# Patient Record
Sex: Female | Born: 1960 | Race: Black or African American | Hispanic: No | Smoking: Former smoker
Health system: Southern US, Community
[De-identification: ages and names within clinical notes are randomized; demographics above are authoritative.]

## PROBLEM LIST (undated history)

## (undated) DIAGNOSIS — K449 Diaphragmatic hernia without obstruction or gangrene: Secondary | ICD-10-CM

## (undated) DIAGNOSIS — B019 Varicella without complication: Secondary | ICD-10-CM

## (undated) DIAGNOSIS — E669 Obesity, unspecified: Secondary | ICD-10-CM

## (undated) DIAGNOSIS — J309 Allergic rhinitis, unspecified: Secondary | ICD-10-CM

## (undated) DIAGNOSIS — E785 Hyperlipidemia, unspecified: Secondary | ICD-10-CM

## (undated) HISTORY — PX: MENISCUS REPAIR: SHX5179

## (undated) HISTORY — DX: Allergic rhinitis, unspecified: J30.9

## (undated) HISTORY — PX: MYOMECTOMY: SHX85

## (undated) HISTORY — DX: Varicella without complication: B01.9

## (undated) HISTORY — DX: Hyperlipidemia, unspecified: E78.5

## (undated) HISTORY — DX: Diaphragmatic hernia without obstruction or gangrene: K44.9

## (undated) HISTORY — PX: TONSILLECTOMY AND ADENOIDECTOMY: SUR1326

## (undated) HISTORY — PX: DENTAL SURGERY: SHX609

## (undated) HISTORY — DX: Obesity, unspecified: E66.9

## (undated) HISTORY — PX: OOPHORECTOMY: SHX86

---

## 2000-12-22 ENCOUNTER — Emergency Department (HOSPITAL_COMMUNITY): Admission: EM | Admit: 2000-12-22 | Discharge: 2000-12-23 | Payer: Self-pay | Admitting: Emergency Medicine

## 2002-06-12 ENCOUNTER — Other Ambulatory Visit: Admission: RE | Admit: 2002-06-12 | Discharge: 2002-06-12 | Payer: Self-pay | Admitting: *Deleted

## 2003-06-17 ENCOUNTER — Emergency Department (HOSPITAL_COMMUNITY): Admission: EM | Admit: 2003-06-17 | Discharge: 2003-06-18 | Payer: Self-pay | Admitting: Emergency Medicine

## 2003-06-18 ENCOUNTER — Encounter: Payer: Self-pay | Admitting: Emergency Medicine

## 2003-08-29 ENCOUNTER — Encounter: Payer: Self-pay | Admitting: Internal Medicine

## 2003-08-29 ENCOUNTER — Ambulatory Visit (HOSPITAL_COMMUNITY): Admission: RE | Admit: 2003-08-29 | Discharge: 2003-08-29 | Payer: Self-pay | Admitting: Internal Medicine

## 2005-11-22 HISTORY — PX: HYSTEROSCOPY: SHX211

## 2005-12-15 ENCOUNTER — Other Ambulatory Visit: Admission: RE | Admit: 2005-12-15 | Discharge: 2005-12-15 | Payer: Self-pay | Admitting: Gynecology

## 2005-12-28 ENCOUNTER — Ambulatory Visit (HOSPITAL_BASED_OUTPATIENT_CLINIC_OR_DEPARTMENT_OTHER): Admission: RE | Admit: 2005-12-28 | Discharge: 2005-12-28 | Payer: Self-pay | Admitting: Gynecology

## 2005-12-28 ENCOUNTER — Encounter (INDEPENDENT_AMBULATORY_CARE_PROVIDER_SITE_OTHER): Payer: Self-pay | Admitting: *Deleted

## 2009-11-27 ENCOUNTER — Ambulatory Visit: Payer: Self-pay | Admitting: Gynecology

## 2009-11-27 ENCOUNTER — Other Ambulatory Visit: Admission: RE | Admit: 2009-11-27 | Discharge: 2009-11-27 | Payer: Self-pay | Admitting: Gynecology

## 2009-12-04 ENCOUNTER — Ambulatory Visit: Payer: Self-pay | Admitting: Gynecology

## 2009-12-16 ENCOUNTER — Ambulatory Visit: Payer: Self-pay | Admitting: Gynecology

## 2010-01-20 HISTORY — PX: ABDOMINAL HYSTERECTOMY: SHX81

## 2010-02-12 ENCOUNTER — Ambulatory Visit: Payer: Self-pay | Admitting: Gynecology

## 2010-02-18 ENCOUNTER — Inpatient Hospital Stay (HOSPITAL_COMMUNITY): Admission: RE | Admit: 2010-02-18 | Discharge: 2010-02-20 | Payer: Self-pay | Admitting: Gynecology

## 2010-02-18 ENCOUNTER — Ambulatory Visit: Payer: Self-pay | Admitting: Gynecology

## 2010-02-18 ENCOUNTER — Encounter: Payer: Self-pay | Admitting: Gynecology

## 2010-03-03 ENCOUNTER — Ambulatory Visit: Payer: Self-pay | Admitting: Gynecology

## 2010-03-17 ENCOUNTER — Ambulatory Visit: Payer: Self-pay | Admitting: Gynecology

## 2010-03-31 ENCOUNTER — Ambulatory Visit: Payer: Self-pay | Admitting: Gynecology

## 2011-02-15 LAB — COMPREHENSIVE METABOLIC PANEL
ALT: 66 U/L — ABNORMAL HIGH (ref 0–35)
AST: 42 U/L — ABNORMAL HIGH (ref 0–37)
Albumin: 3.9 g/dL (ref 3.5–5.2)
Alkaline Phosphatase: 125 U/L — ABNORMAL HIGH (ref 39–117)
BUN: 7 mg/dL (ref 6–23)
CO2: 24 mEq/L (ref 19–32)
Calcium: 9.6 mg/dL (ref 8.4–10.5)
Chloride: 106 mEq/L (ref 96–112)
Creatinine, Ser: 0.54 mg/dL (ref 0.4–1.2)
GFR calc Af Amer: >60 mL/min (ref >60)
GFR calc non Af Amer: >60 mL/min (ref >60)
Glucose, Bld: 92 mg/dL (ref 70–99)
Potassium: 4 mEq/L (ref 3.5–5.1)
Sodium: 138 mEq/L (ref 135–145)
Total Bilirubin: 0.4 mg/dL (ref 0.3–1.2)
Total Protein: 7.1 g/dL (ref 6.0–8.3)

## 2011-02-15 LAB — CBC
HCT: 23.3 % — ABNORMAL LOW (ref 36.0–46.0)
HCT: 39.3 % (ref 36.0–46.0)
Hemoglobin: 12.4 g/dL (ref 12.0–15.0)
Hemoglobin: 7.4 g/dL — ABNORMAL LOW (ref 12.0–15.0)
MCHC: 31.5 g/dL (ref 30.0–36.0)
MCHC: 31.9 g/dL (ref 30.0–36.0)
MCV: 76.7 fL — ABNORMAL LOW (ref 78.0–100.0)
MCV: 77 fL — ABNORMAL LOW (ref 78.0–100.0)
Platelets: 313 10*3/uL (ref 150–400)
Platelets: 357 10*3/uL (ref 150–400)
RBC: 3.03 MIL/uL — ABNORMAL LOW (ref 3.87–5.11)
RBC: 5.12 MIL/uL — ABNORMAL HIGH (ref 3.87–5.11)
RDW: 28 % — ABNORMAL HIGH (ref 11.5–15.5)
RDW: 28.6 % — ABNORMAL HIGH (ref 11.5–15.5)
WBC: 8 10*3/uL (ref 4.0–10.5)
WBC: 9.2 10*3/uL (ref 4.0–10.5)

## 2011-02-15 LAB — HCG, SERUM, QUALITATIVE: Preg, Serum: NEGATIVE

## 2011-04-09 NOTE — Op Note (Signed)
NAME:  Patty Perkins, Patty Perkins            ACCOUNT NO.:  [id number]   MEDICAL RECORD NO.:  [id number]          PATIENT TYPE:  AMB   LOCATION:  [facility]                         FACILITY:  [facility]   PHYSICIAN:  Timothy P. Fontaine, M.D.DATE OF BIRTH:  1961/10/28   DATE OF PROCEDURE:  12/28/2005  DATE OF DISCHARGE:                                 OPERATIVE REPORT   PREOPERATIVE DIAGNOSES:  Menorrhagia, leiomyomata.   POSTOPERATIVE DIAGNOSES:  Menorrhagia, leiomyomata.   PROCEDURE:  Hysteroscopic, myomectomy, D&C.   SURGEON:  Dr. Audie Box.   ANESTHETIC:  General.   ESTIMATED BLOOD LOSS:  Approximately 28-30 mL.   SORBITOL DISCREPANCY:  200 mL.   SPECIMEN:  1.  Endometrial curetting  2.  Leiomyomata.   COMPLICATIONS:  None.   FINDINGS:  EUA external BUS and vagina normal.  Cervix normal.  Uterus  bulky, approximately 14-16 weeks' size, irregular.  Adnexa without masses.  Hysteroscopic evaluation large posterior mid cavity myoma submucous mucus  approximately 50% within cavity, smaller 2 cm lower uterine segment anterior  myoma approximately 75% within cavity right, and left tubal ostia  visualized; fundus visualized grossly normal; lower uterine segment  otherwise unremarkable; endocervical canal normal.   PROCEDURE:  The patient was taken to the operating room, underwent general  anesthesia, was placed in low dorsal lithotomy position, received a perineal  vaginal preparation with Betadine solution per nursing personnel.  Bladder  emptied with in-and-out Foley catheterization.  EUA performed, and the  patient was draped in the usual fashion.  Cervix was visualized with a  surgical speculum, anterior lip grasped with a single-tooth tenaculum, and  the cervix was gently gradually dilated to admit the operative hysteroscope.  Hysteroscopy was performed with findings noted above.  Using the right angle  resectoscopic loop, the posterior submucous myoma was progressively resected  to the  level of the surrounding endometrium with multiple passes.  The  remaining portion of the myoma was left as it remained within the wall of  the uterus and was felt unsafe to resect it deeper.  The smaller anterior  lower uterine segment myoma was resected in its entirety through progressive  passes with the right-angle resectoscopic loop, as more of the myoma  protruded into the cavity with each removal of the resectoscope to remove  resected fragments.  Subsequently, using the roller ball coagulation, the  remaining posterior wall myoma surface was coagulated to achieve hemostasis  and hopefully to obliterate any remaining myoma.  A sharp curettage was  performed for sampling and at the end of the procedure, rehysteroscopy  showed no significant bleeding sites, good distension, no evidence of  perforation.  The instruments were removed.  Pressure was applied to the  tenaculum site on the cervix to achieve ultimate hemostasis.  Scant cervical  bleeding was noted.  The patient's bladder was in-and-out catheterized with  a Foley catheter in sterile technique, producing a small amount of clear  yellow urine.  The patient was then placed in the supine position, awakened  without difficulty, and taken to the recovery room in good condition, having  tolerated the procedure well.  Timothy P. Fontaine, M.D.  Electronically Signed     TPF/MEDQ  D:  12/28/2005  T:  12/28/2005  Job:  [id number]

## 2011-04-09 NOTE — H&P (Signed)
NAME:  Patty Perkins, Patty Perkins            ACCOUNT NO.:  [id number]   MEDICAL RECORD NO.:  [id number]          PATIENT TYPE:  AMB   LOCATION:  [facility]                         FACILITY:  [facility]   PHYSICIAN:  Timothy P. Fontaine, M.D.DATE OF BIRTH:  1961-08-05   DATE OF ADMISSION:  12/28/2005  DATE OF DISCHARGE:                                HISTORY & PHYSICAL   CHIEF COMPLAINT:  Leiomyomata, menorrhagia.   HISTORY OF PRESENT ILLNESS:  A 50 year old G0 with a long history of  leiomyomata, status post myomectomy in the past, who presents with a history  of increasing menorrhagia, who most recently underwent transfusion due to a  hemoglobin of 7, with a 16-week size irregular uterus.  Ultrasound confirms  multiple myomas.  Attempt at sonohystogram was unable to clearly visualize  the endometrium, and she is admitted for hysteroscopy, submucous myomectomy.   PAST SURGICAL HISTORY:  1.  Wisdom teeth.  2.  Tonsillectomy.  3.  Leiomyomectomy.   PAST MEDICAL HISTORY:  Uncomplicated.   ALLERGIES:  No medications.   REVIEW OF SYSTEMS:  Noncontributory.   FAMILY HISTORY:  Noncontributory.   SOCIAL HISTORY:  Noncontributory.   ADMISSION PHYSICAL EXAMINATION:  VITAL SIGNS:  Afebrile.  Vital signs are  stable.  HEENT:  Normal.  LUNGS:  Clear.  CARDIAC:  Regular rate and rhythm.  No murmurs or gallops.  ABDOMEN:  Benign.  PELVIC:  External BUS, vagina normal.  Cervix normal.  Uterus is  approximately 16 weeks size, irregular, consistent with leiomyomata.   ASSESSMENT AND PLAN:  A 50 year old G0 with history of leiomyomata with  menorrhagia, status post transfusion for hysteroscopy submucous  myomectomies.  I reviewed with the patient in detail the proposed surgery to  include what is involved with hysteroscopy and submucous myomectomy.  Options for most aggressive therapy such as hysterectomy, were reviewed, and  the options for this were discussed, and the patient would prefer an attempt  at  the more conservative hysteroscopy at this time.  She understands that  there are no guarantees as far as bleeding relief, that her bleeding may  persist, worsen or change following the procedure, given the number of  myomas that she has, and she understands that clearly we are only addressing  the intracavity portions of any myomas, and that we cannot go into the  myometrium to resect these, and that she will have myomas remaining  following the surgery, and she understands and accepts this.  I reviewed  with her the expected intraoperative and postoperative courses, the  instrumentation, risks of the resectoscope, and use of distended medium.  The risks including bleeding, hemorrhage, transfusion, and the risks of the  transfusion including transfusion reaction, hepatitis, HIV, mad cow disease,  and other unknown entities was discussed, understood and accepted.  The  risks of infection, both internal requiring prolonged antibiotics, as well  as the risks of perforation, damage to internal organs including bowel,  bladder, ureters, vessels and nerves necessitating major exploratory  reparative surgeries and future reparative surgeries including ostomy  formation, was all discussed, understood and accepted.  The risks of  sorbitol absorption leading  to metabolic complications and seizures were all  discussed, understood and accepted.  She understands that if we reach a  point where absorption is an issue, we may need to terminate the procedure  at that time; although we may not be totally done with resection, we may  have to stage the procedure and re-hysteroscope her at a future date.  Lastly, I emphasized there were no guarantees as far as menorrhagia relief  with the resectoscopic surgery, and that hysterectomy would afford absolute  relief, and she understands this and wants to proceed with a more  conservative procedure at this time.  Her thyroid studies were normal.  Her  current  hemoglobin is 11.  She did have a mild elevation in her SGOT, SGPT,  with a repeat value showing a normal SGOT and a decreasing SGPT.  The  patient's questions were answered to her satisfaction, and she is ready to  proceed with surgery.      Timothy P. Fontaine, M.D.  Electronically Signed     TPF/MEDQ  D:  12/27/2005  T:  12/27/2005  Job:  [id number]

## 2011-04-29 ENCOUNTER — Encounter (INDEPENDENT_AMBULATORY_CARE_PROVIDER_SITE_OTHER): Payer: Federal, State, Local not specified - PPO | Admitting: Gynecology

## 2011-04-29 ENCOUNTER — Other Ambulatory Visit: Payer: Self-pay | Admitting: Gynecology

## 2011-04-29 ENCOUNTER — Other Ambulatory Visit (HOSPITAL_COMMUNITY)
Admission: RE | Admit: 2011-04-29 | Discharge: 2011-04-29 | Disposition: A | Discharge status: Home / Self Care | Payer: Federal, State, Local not specified - PPO | Source: Ambulatory Visit | Attending: Gynecology | Admitting: Gynecology

## 2011-04-29 DIAGNOSIS — Z13220 Encounter for screening for lipoid disorders: Secondary | ICD-10-CM

## 2011-04-29 DIAGNOSIS — B373 Candidiasis of vulva and vagina: Secondary | ICD-10-CM

## 2011-04-29 DIAGNOSIS — R82998 Other abnormal findings in urine: Secondary | ICD-10-CM

## 2011-04-29 DIAGNOSIS — Z124 Encounter for screening for malignant neoplasm of cervix: Secondary | ICD-10-CM | POA: Insufficient documentation

## 2011-04-29 DIAGNOSIS — R635 Abnormal weight gain: Secondary | ICD-10-CM

## 2011-04-29 DIAGNOSIS — Z01419 Encounter for gynecological examination (general) (routine) without abnormal findings: Secondary | ICD-10-CM

## 2011-04-29 DIAGNOSIS — Z833 Family history of diabetes mellitus: Secondary | ICD-10-CM

## 2011-05-13 ENCOUNTER — Ambulatory Visit: Payer: Federal, State, Local not specified - PPO | Admitting: Gynecology

## 2011-05-13 ENCOUNTER — Other Ambulatory Visit: Payer: Federal, State, Local not specified - PPO

## 2011-06-03 ENCOUNTER — Ambulatory Visit: Payer: Federal, State, Local not specified - PPO | Admitting: Gynecology

## 2011-06-04 ENCOUNTER — Ambulatory Visit (INDEPENDENT_AMBULATORY_CARE_PROVIDER_SITE_OTHER): Payer: Federal, State, Local not specified - PPO | Admitting: Gynecology

## 2011-06-04 ENCOUNTER — Other Ambulatory Visit: Payer: Federal, State, Local not specified - PPO

## 2011-06-04 DIAGNOSIS — N949 Unspecified condition associated with female genital organs and menstrual cycle: Secondary | ICD-10-CM

## 2011-06-04 DIAGNOSIS — N898 Other specified noninflammatory disorders of vagina: Secondary | ICD-10-CM

## 2011-06-04 DIAGNOSIS — N760 Acute vaginitis: Secondary | ICD-10-CM

## 2011-06-04 DIAGNOSIS — B373 Candidiasis of vulva and vagina: Secondary | ICD-10-CM

## 2011-06-04 DIAGNOSIS — R1904 Left lower quadrant abdominal swelling, mass and lump: Secondary | ICD-10-CM

## 2011-06-07 ENCOUNTER — Other Ambulatory Visit: Payer: Self-pay | Admitting: Gynecology

## 2011-06-07 DIAGNOSIS — R1900 Intra-abdominal and pelvic swelling, mass and lump, unspecified site: Secondary | ICD-10-CM

## 2011-06-10 ENCOUNTER — Ambulatory Visit (HOSPITAL_COMMUNITY)
Admission: RE | Admit: 2011-06-10 | Discharge: 2011-06-10 | Disposition: A | Discharge status: Home / Self Care | Payer: Federal, State, Local not specified - PPO | Source: Ambulatory Visit | Attending: Gynecology | Admitting: Gynecology

## 2011-06-10 DIAGNOSIS — R1900 Intra-abdominal and pelvic swelling, mass and lump, unspecified site: Secondary | ICD-10-CM

## 2011-06-10 DIAGNOSIS — R1909 Other intra-abdominal and pelvic swelling, mass and lump: Secondary | ICD-10-CM | POA: Insufficient documentation

## 2011-06-10 MED ORDER — IOHEXOL 300 MG/ML  SOLN
100.0000 mL | Freq: Once | INTRAMUSCULAR | Status: AC | PRN
  Administered 2011-06-10: 100 mL via INTRAVENOUS

## 2011-06-15 ENCOUNTER — Telehealth: Payer: Self-pay | Admitting: Gynecology

## 2011-06-15 DIAGNOSIS — R1900 Intra-abdominal and pelvic swelling, mass and lump, unspecified site: Secondary | ICD-10-CM

## 2011-06-15 DIAGNOSIS — N898 Other specified noninflammatory disorders of vagina: Secondary | ICD-10-CM

## 2011-06-15 MED ORDER — METRONIDAZOLE 500 MG PO TABS: 500.0000 mg | ORAL_TABLET | Freq: Two times a day (BID) | ORAL | Status: AC

## 2011-06-15 NOTE — Telephone Encounter (Signed)
RX SENT TO PHARMACY. REFERRAL SENT TO [facility].

## 2011-06-15 NOTE — Telephone Encounter (Signed)
Called patient today in follow up of her pelvic CT which confirms a cystic mass measuring 12.8 x 17 cm a single enhancing septation. Mass does not contain fat or calcification no free pelvic fluid. 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 Her CA 125 return 3.2. I again does reviewed the differential to include a post surgical peritoneal inclusion cyst or adhesive cyst possible liquefied hematoma or an ovarian process as well as a possible diverticular process. Given the total picture I think referral to [facility] would be appropriate to allow for possible robotic surgery and to have available various specialties in the event of the various differential possibilities. Patient agrees with this plan and is accepting of referral to [facility]. We make these arrangements for her and provide them with a copy of my operative report, pathology report, postoperative notes and the CT and ultrasound reports. She also notes that the vaginal discharge I treated her for seem to get better but now recurred. I'm going to treat her with a more extended course of Flagyl 500 twice a day x14 days and my staff will call this in to [facility] in [place] on [address]. I did discuss with her I doubt that this cystic mass is draining into the vagina to account for her discharge as it would be highly unlikely as well as it wouldn't improve with treatment with antibiotics and then recur after stopping this. Encouraged patient should any questions or confusion to call me.

## 2011-06-22 ENCOUNTER — Encounter: Payer: Self-pay | Admitting: Gynecology

## 2011-07-22 HISTORY — PX: EXPLORATORY LAPAROTOMY: SUR591

## 2011-07-23 ENCOUNTER — Encounter: Payer: Self-pay | Admitting: Gynecology

## 2011-08-13 ENCOUNTER — Encounter: Payer: Self-pay | Admitting: Gynecology

## 2011-10-25 ENCOUNTER — Encounter: Payer: Self-pay | Admitting: Gynecology

## 2011-10-25 ENCOUNTER — Ambulatory Visit (INDEPENDENT_AMBULATORY_CARE_PROVIDER_SITE_OTHER): Payer: Federal, State, Local not specified - PPO | Admitting: Gynecology

## 2011-10-25 DIAGNOSIS — N898 Other specified noninflammatory disorders of vagina: Secondary | ICD-10-CM

## 2011-10-25 DIAGNOSIS — N939 Abnormal uterine and vaginal bleeding, unspecified: Secondary | ICD-10-CM

## 2011-10-25 DIAGNOSIS — B373 Candidiasis of vulva and vagina: Secondary | ICD-10-CM

## 2011-10-25 DIAGNOSIS — R21 Rash and other nonspecific skin eruption: Secondary | ICD-10-CM

## 2011-10-25 DIAGNOSIS — M79602 Pain in left arm: Secondary | ICD-10-CM

## 2011-10-25 DIAGNOSIS — M79609 Pain in unspecified limb: Secondary | ICD-10-CM

## 2011-10-25 NOTE — Patient Instructions (Signed)
Follow up if vaginal bleeding continues

## 2011-10-25 NOTE — Progress Notes (Signed)
Addended byCammie Mcgee T on: 10/25/2011 02:09 PM   Modules accepted: Orders

## 2011-10-25 NOTE — Progress Notes (Signed)
Patient presents for several issues. She is status post exploratory laparoscopy LSO for large inclusion cyst lysis of adhesions at [facility] in August. She notes recurrent skin rash with itching and irritation under her panniculus. Also last week had vaginal spotting for approximately one week. She is status post TAH RSO in the past and his most recent exploratory laparoscopy and also lysis of adhesions. She's not having any vaginal irritation or other symptoms she's not sexually active but noticed spontaneous bright red staining. Patient also asked for a referral to the physical therapist due to persistent left arm weakness and pain. She apparently had IV infiltration in the hospital in August and said ever since then she's had more weakness in her left arm. She has no neck pain but does note some pain that radiates down the arm from the upper to lower arm. She also asked for referral to plastic surgery to discussed having her panniculus excised the she has lost a fair amount of weight on purpose and has these recurrent skin rashes.  Exam HEENT: Grossly normal Extremities: Left arm with normal range of motion normal strength without sensory deficit good peripheral pulses and capillary refill. Abdomen: Slight dermatitis in the panniculus consistent with a yeast. Pelvic: External BUS vagina with slight brown staining upper vagina. After wiping no active areas of bleeding could be seen or lesions visualized. Bimanual without masses or tenderness vagina palpates soft without lesions. Subsequent colposcopy was normal noting no vaginal abnormalities visually.  Assessment and plan: 1. Vaginal staining. A little unusual she did not have an overt vaginitis no lesions were visualized or palpated both grossly or on colposcopic evaluation. She did have a little bit of whitish discharge KOH wet prep is positive for yeast. I discussed with patient at this point I will have to attribute her staining to yeast and I  treated her with Diflucan 150x1 dose follow up if her bleeding would persist.   2. Skin rash. The think this is a classic yeast dermatitis. I wrote her prescriptions for Mytrex cream to apply twice a day as needed follow up if symptoms persist or recur. 3. Plastic surgery. I gave her referral to several plastic surgeons in town for a complaint be evaluated for surgery. 4. Left arm pain with reported weakness. Physical exam is normal. Hard to attribute this all to an infiltrated IV. Refer to neurology for evaluation rule out other etiologies. If they feel physical therapy would be warranted they will refer her.

## 2011-10-26 ENCOUNTER — Telehealth: Payer: Self-pay | Admitting: *Deleted

## 2011-10-26 ENCOUNTER — Encounter: Payer: Self-pay | Admitting: Neurology

## 2011-10-26 ENCOUNTER — Other Ambulatory Visit: Payer: Self-pay | Admitting: *Deleted

## 2011-10-26 DIAGNOSIS — R29898 Other symptoms and signs involving the musculoskeletal system: Secondary | ICD-10-CM

## 2011-10-26 NOTE — Telephone Encounter (Signed)
Patient informed appointment set with Dr. Modesto Charon on 12/09/11 @ 2:30pm.  Order has been placed in pc.

## 2011-10-26 NOTE — Telephone Encounter (Signed)
Message copied by Valeda Malm L on Tue Oct 26, 2011 12:24 PM ------      Message from: Dara Lords      Created: Mon Oct 25, 2011  3:10 PM       Natina Wiginton, schedule an appointment with a neurologist in reference to left arm weakness and discomfort.

## 2011-12-08 ENCOUNTER — Ambulatory Visit: Payer: Federal, State, Local not specified - PPO | Admitting: Neurology

## 2011-12-09 ENCOUNTER — Ambulatory Visit: Payer: Federal, State, Local not specified - PPO | Admitting: Neurology

## 2011-12-09 ENCOUNTER — Encounter: Payer: Self-pay | Admitting: *Deleted

## 2011-12-09 NOTE — Progress Notes (Signed)
Patient ID: Patty Perkins, female   DOB: September 03, 1961, 51 y.o.   MRN: [id number] Pt called and left message on vm she needed records? Pt phone hung. I called and left message on her vm to call back.

## 2011-12-16 ENCOUNTER — Encounter: Payer: Self-pay | Admitting: *Deleted

## 2011-12-16 NOTE — Progress Notes (Signed)
Patient ID: Patty Perkins, female   DOB: 15-Mar-1961, 51 y.o.   MRN: [id number] Pt wanted recent office note faxed to dr [facility]ers office plastic surgeron referred pt to. Done and pt informed.

## 2011-12-31 ENCOUNTER — Ambulatory Visit (INDEPENDENT_AMBULATORY_CARE_PROVIDER_SITE_OTHER): Payer: Federal, State, Local not specified - PPO | Admitting: Neurology

## 2011-12-31 ENCOUNTER — Encounter: Payer: Self-pay | Admitting: Neurology

## 2011-12-31 VITALS — BP 116/78 | HR 96 | Wt 174.0 lb

## 2011-12-31 DIAGNOSIS — R29898 Other symptoms and signs involving the musculoskeletal system: Secondary | ICD-10-CM

## 2011-12-31 NOTE — Patient Instructions (Signed)
We will call you with your appointment for the nerve conduction studies.  They will be done at [facility] [address] [place], [place] [phone].

## 2011-12-31 NOTE — Progress Notes (Signed)
Dear Dr. Audie Box,  Thank you for having me see Patty Perkins in consultation today at [facility] for her problem with left arm pain and weakness.  As you may recall, she is a 51 y.o. year old female with a history of a resection of a large pelvic cyst in 07/22/2011 who apparently in the hospital during recovery had an infiltrated IV in her left antecubital area that resulted in arm swelling.  She says since that time she has had pain and weakness in the forearm, limiting use of the arm.  The pain is burning and stabbing, and can occur anytime.  The weakness is persistent.  She describes difficulty flexing at the elbow or extending the arm, or gripping items.  It does not sound like it has improved over time.  She does not get significant shoulder pain or neck pain.   Past Medical History  Diagnosis Date  . History of hiatal hernia     Past Surgical History  Procedure Date  . Abdominal hysterectomy 01/2010    TAH,RSO  . Hysteroscopy 2007    HYSTEROSCOPIC MYOMECTOMY  . Myomectomy   . Tonsillectomy and adenoidectomy   . Dental surgery     51 YO  . Exploratory laparotomy 07/22/2011    WITH EXT ADHESIOLYSIS, RESECTION OF LARGE PELVIC CYST, LSO AND PROCTOSCOPUY AT [facility]  . Oophorectomy     RSO W/TAH 01/2010--LSO W/ EXP LAP 07/22/11 AT [facility]  . Cervix lesion destruction     History   Social History  . Marital Status: Married    Spouse Name: N/A    Number of Children: N/A  . Years of Education: N/A   Social History Main Topics  . Smoking status: Former Smoker    Quit date: 12/30/2004  . Smokeless tobacco: Never Used  . Alcohol Use: No  . Drug Use: No  . Sexually Active: No   Other Topics Concern  . None   Social History Narrative  . None    Family History  Problem Relation Age of Onset  . Hypertension Mother     No current outpatient prescriptions on file prior to visit.    No Known Allergies    ROS:  13 systems were reviewed andare  unremarkable.   Examination:  Filed Vitals:   12/31/11 1517  BP: 116/78  Pulse: 96  Weight: 174 lb (78.926 kg)     In general, well appearing women.  Cardiovascular: The patient has a regular rate and rhythm and no carotid bruits.  Fundoscopy:  Disks are flat. Vessel caliber within normal limits.  Mental status:   The patient is oriented to person, place and time. Recent and remote memory are intact. Attention span and concentration are normal. Language including repetition, naming, following commands are intact. Fund of knowledge of current and historical events, as well as vocabulary are normal.  Cranial Nerves: Pupils are equally round and reactive to light. Visual fields full to confrontation. Extraocular movements are intact without nystagmus. Facial sensation and muscles of mastication are intact. Muscles of facial expression are symmetric. Hearing intact to bilateral finger rub. Tongue protrusion, uvula, palate midline.  Shoulder shrug intact  Motor: 4+ weakness of left shoulder abduction, left triceps extension, left forearm supination, left brachioradialis finger flexion(ulnar and median),finger extension;   5/5 biceps, wrist extension, ulnar and median wrist flexion, finger abduction;  everywhere else 5/5; atrophy of left forearm?  Reflexes:   Biceps  Triceps Brachioradialis Knee Ankle  Right 2+  2+  2+  2+ 2+  Left  2+  2+  2+   2+ 2+  Toes down  Coordination:  Normal finger to nose.  No dysdiadokinesia.  Sensation is decreased in her left hand but in a non-dermatomal and non-peripheral nerve pattern.  Gait and Station are normal.  Tandem gait is intact.  Romberg is negative   Impression/Recs: I am having a difficult time putting her weakness and symptoms in the distribution of one nerve.  If there is a nerve compression, it appears it may be radial nerve, but finger abduction, finger extension and shoulder abduction should not be weak in these cases.  Also, if  it is a  radial nerve palsy I can't explain how triceps could be weak from a IV infiltration in the forearm.  I am going to an EMG/NCS of the left upper extremity to determine if there is focal nerve damage.     We will see the patient back after her EMG/NCS.  Thank you for having [place] see Patty Perkins in consultation.  Feel free to contact me with any questions.  Lupita Raider Modesto Charon, MD [facility], [place] [address] [place], [place] [address] Phone: [phone] Fax: [phone].

## 2012-01-20 ENCOUNTER — Telehealth: Payer: Self-pay | Admitting: Neurology

## 2012-01-20 NOTE — Telephone Encounter (Signed)
You can offer that we send her to [facility] for a repeat EMG/NCS but warn her that I am not sure insurance will cover the test twice.

## 2012-01-20 NOTE — Telephone Encounter (Signed)
Called and spoke with the patient. Informed that the NCV/EMG was normal and without nerve damage. The patient states that she is very upset by this as she still cannot grip, make a fist and pick up heavy things. She proceeded to report that the facility where she had the test "kept getting false readings according to the technician and that the equipment wasn't the best." The patient states she will report this facility to the medical board. She wants to have another test where the equipment is reliable and the technicians know what they are doing." I told her I would let Dr. Modesto Charon know of her experience and we would be in touch.  **Dr. Modesto Charon, please advise.

## 2012-01-20 NOTE — Telephone Encounter (Signed)
Called and spoke with the patient. Info given as directed by Dr. Modesto Charon below. The patient understands that insurance may not pay for the procedure a second time. She wishes to proceed. I did call [facility]diagn[facility] and faxed over all requested information. They will fax back an appointment date and time. I will then let the patient know. The patient is fine with this plan.

## 2012-01-20 NOTE — Telephone Encounter (Signed)
Message copied by Benay Spice on Thu Jan 20, 2012  1:50 PM ------      Message from: Milas Gain      Created: Thu Jan 20, 2012  1:21 PM       Let Ms. Mallow know that her EMG/NCS of her LUE was normal.  No signs of any nerve damage.

## 2012-01-20 NOTE — Progress Notes (Signed)
EMG/NCS was negative for neuropathy or radiculopathy of the left UE.

## 2012-01-21 NOTE — Telephone Encounter (Signed)
Called and spoke with the patient. Information given re: appointment date and time for the NCV/EMG at [facility] on 04/11 at 0800 with Dr. Alphonzo Dublin. Phone number given [phone]) as well in case the pt needed to reschedule. No other issues at this time. A copy of the faxed letter from the Diagnostic Neurology Department will be mailed to her home address. That was verified with the patient.

## 2012-01-24 ENCOUNTER — Encounter: Payer: Self-pay | Admitting: Neurology

## 2012-01-28 ENCOUNTER — Ambulatory Visit (INDEPENDENT_AMBULATORY_CARE_PROVIDER_SITE_OTHER): Payer: Federal, State, Local not specified - PPO | Admitting: Gynecology

## 2012-01-28 ENCOUNTER — Encounter: Payer: Self-pay | Admitting: Gynecology

## 2012-01-28 DIAGNOSIS — R21 Rash and other nonspecific skin eruption: Secondary | ICD-10-CM

## 2012-01-28 DIAGNOSIS — N898 Other specified noninflammatory disorders of vagina: Secondary | ICD-10-CM

## 2012-01-28 MED ORDER — NYSTATIN 100000 UNIT/GM EX POWD: CUTANEOUS | Status: DC

## 2012-01-28 MED ORDER — FLUCONAZOLE 200 MG PO TABS: 200.0000 mg | ORAL_TABLET | Freq: Every day | ORAL | Status: AC

## 2012-01-28 MED ORDER — NYSTATIN-TRIAMCINOLONE 100000-0.1 UNIT/GM-% EX OINT: TOPICAL_OINTMENT | Freq: Two times a day (BID) | CUTANEOUS | Status: DC

## 2012-01-28 NOTE — Progress Notes (Signed)
Patient presents with recurrent skin rash and her panniculus and vaginal discharge. She notes when she takes the Diflucan intermittently her vaginal discharge this appears but it seems to return. 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 She had been using Mytrex type cream intermittently but has an episode now that's worse than before.  Exam Amy chaperone present Abdomen with an intense excoriated rash in her panniculus consistent with fungal with odor. Pelvic external BUS vagina with whitish discharge. Bimanual without masses or tenderness  Assessment and plan: Yeast skin rash in her panniculus and probable yeast vaginitis. We'll treat with Diflucan 200 daily x14 days given the intensity of the rash and I refilled her Mytrex cream to use twice a day. I also gave her prescription for Nystop powder to use once the rash is cleared a daily basis to help prevent recurrence. She is pursuing an abdominoplasty to have her panniculus removed which I think is very appropriate because of this intense fungal dermatitis that will continue to plague her until she does this. This certainly is not a cosmetic indication but a quality-of-life surgery. I also put a refill on her Diflucan and asked her after 14 days to take it once weekly as a prophylactic until she's able to arrange for surgery.

## 2012-01-28 NOTE — Patient Instructions (Signed)
Use the medications as prescribed. Pursue abdominoplasty surgery. Follow up with me when you're due for your annual exam.

## 2012-09-06 ENCOUNTER — Encounter: Payer: Federal, State, Local not specified - PPO | Admitting: Gynecology

## 2012-12-26 ENCOUNTER — Other Ambulatory Visit (HOSPITAL_COMMUNITY)
Admission: RE | Admit: 2012-12-26 | Discharge: 2012-12-26 | Disposition: A | Discharge status: Home / Self Care | Payer: Federal, State, Local not specified - PPO | Source: Ambulatory Visit | Attending: Gynecology | Admitting: Gynecology

## 2012-12-26 ENCOUNTER — Encounter: Payer: Self-pay | Admitting: Gynecology

## 2012-12-26 ENCOUNTER — Ambulatory Visit (INDEPENDENT_AMBULATORY_CARE_PROVIDER_SITE_OTHER): Payer: Federal, State, Local not specified - PPO | Admitting: Gynecology

## 2012-12-26 VITALS — BP 124/82 | Ht 63.0 in | Wt 196.0 lb

## 2012-12-26 DIAGNOSIS — Z01419 Encounter for gynecological examination (general) (routine) without abnormal findings: Secondary | ICD-10-CM | POA: Insufficient documentation

## 2012-12-26 DIAGNOSIS — Z13220 Encounter for screening for lipoid disorders: Secondary | ICD-10-CM

## 2012-12-26 DIAGNOSIS — Z8719 Personal history of other diseases of the digestive system: Secondary | ICD-10-CM | POA: Insufficient documentation

## 2012-12-26 DIAGNOSIS — N898 Other specified noninflammatory disorders of vagina: Secondary | ICD-10-CM

## 2012-12-26 LAB — LIPID PANEL
HDL: 52 mg/dL (ref >39)
Total CHOL/HDL Ratio: 4.6 Ratio

## 2012-12-26 LAB — COMPREHENSIVE METABOLIC PANEL
AST: 23 U/L (ref 0–37)
Alkaline Phosphatase: 125 U/L — ABNORMAL HIGH (ref 39–117)
BUN: 9 mg/dL (ref 6–23)
Calcium: 9.6 mg/dL (ref 8.4–10.5)
Chloride: 104 mEq/L (ref 96–112)
Creat: 0.59 mg/dL (ref 0.50–1.10)
Total Bilirubin: 0.5 mg/dL (ref 0.3–1.2)

## 2012-12-26 LAB — CBC WITH DIFFERENTIAL/PLATELET
Basophils Absolute: 0 10*3/uL (ref 0.0–0.1)
Basophils Relative: 0 % (ref 0–1)
Eosinophils Absolute: 0.3 10*3/uL (ref 0.0–0.7)
Eosinophils Relative: 5 % (ref 0–5)
HCT: 39.2 % (ref 36.0–46.0)
MCH: 26.9 pg (ref 26.0–34.0)
MCHC: 33.2 g/dL (ref 30.0–36.0)
MCV: 81 fL (ref 78.0–100.0)
Monocytes Absolute: 0.5 10*3/uL (ref 0.1–1.0)
RDW: 15.1 % (ref 11.5–15.5)

## 2012-12-26 LAB — WET PREP FOR TRICH, YEAST, CLUE
Trich, Wet Prep: NONE SEEN
Yeast Wet Prep HPF POC: NONE SEEN

## 2012-12-26 MED ORDER — IBUPROFEN 800 MG PO TABS: 800.0000 mg | ORAL_TABLET | Freq: Three times a day (TID) | ORAL | Status: DC | PRN

## 2012-12-26 MED ORDER — METRONIDAZOLE 500 MG PO TABS: 500.0000 mg | ORAL_TABLET | Freq: Two times a day (BID) | ORAL | Status: DC

## 2012-12-26 MED ORDER — NYSTATIN-TRIAMCINOLONE 100000-0.1 UNIT/GM-% EX OINT: TOPICAL_OINTMENT | Freq: Two times a day (BID) | CUTANEOUS | Status: DC

## 2012-12-26 NOTE — Progress Notes (Signed)
Patty Perkins June 01, 1961 [id number]        52 y.o.  G2P0020 for annual exam.  Several issues noted below.  Past medical history,surgical history, medications, allergies, family history and social history were all reviewed and documented in the EPIC chart. ROS:  Was performed and pertinent positives and negatives are included in the history.  Exam: Kim assistant Filed Vitals:   12/26/12 1202  BP: 124/82  Height: 5\' 3"  (1.6 m)  Weight: 196 lb (88.905 kg)   General appearance  Normal Skin grossly normal Head/Neck normal with no cervical or supraclavicular adenopathy thyroid normal Lungs  clear Cardiac RR, without RMG Abdominal  soft, nontender, without masses, organomegaly or hernia Breasts  examined lying and sitting without masses, retractions, discharge or axillary adenopathy. Pelvic  Ext/BUS/vagina  normal without overt discharge  Adnexa  Without masses or tenderness    Anus and perineum  normal   Rectovaginal  normal sphincter tone without palpated masses or tenderness.    Assessment/Plan:  52 y.o. G105P0020 female for annual exam.   1. Vaginal discharge. Patient's had a brownish discharge since surgery on and off by her history. She is status post TAH RSO 2011. Subsequent exploratory laparotomy adhesion lysis LSO resection of pelvic inclusion cyst 2012 at [facility].  Her vaginal discharge comes and goes. She's been treated for yeast and bacterial vaginosis in the past. She notes that the discharge always gets better with treatment but then recurs. She's not sexually active. Exam today is unremarkable without significant discharge. Wet prep again is unremarkable. I'm going to treat her as a low-level bacterial vaginosis with Flagyl 500 mg twice a day x14 days to see if we can eradicate this.  Alcohol avoidance discussed.  I reviewed sources of discharge with her to include unusual causes such as enterovaginal fistulas but this certainly does not sound like that nor can be documented  on vigorous vaginal exam. Patient will follow up if her discharge persists or recurs. 2. 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 Patient uses Mytrex which helps I refilled her x3. 3. Headaches. Patient notes over the past month daily headaches lasting all day requiring repetitive pain medication. No neurologic symptoms or classic migraine type auras.  We'll treat with Motrin 800 mg #30 every 8 hours 1 refill and recommend appointment with neurologist Dr. Clarisse Gouge. We'll go ahead and arrange this for her. 4. Pap smear 2012.  Pap done today because of this persistent discharge. No history of abnormal Pap smears previously. 5. Mammography. Patient's never had and I strongly urged her again to schedule. I reviewed the benefits of early detection and she acknowledges my recommendations. SBE monthly reviewed.  6. DEXA. We'll plan further into the menopause. Increase calcium vitamin D reviewed. 7. Colonoscopy 2009. Found polyps and they recommended repeat in 5 years which is this year. Patient will schedule colonoscopy this year. 8. Health maintenance. Mild elevated blood pressure noted to the patient. I asked her to have it rechecked in a non-exam situation to make sure it's normal. If it would remain elevated she knows to follow up.  Baseline CBC lipid profile comprehensive metabolic panel TSH vitamin D urinalysis ordered.   Dara Lords MD, 12:52 PM 12/26/2012

## 2012-12-26 NOTE — Patient Instructions (Signed)
Take Flagyl medication twice daily for 14 days. Avoid alcohol while taking.  Schedule colonoscopy with either [facility] gastro[facility] or [facility] gastroenterology.  Office and contact you to schedule an appointment with a neurologist in reference to your headaches.  .Call to Schedule your mammogram  Facilities in [place]: 1)  [facility] [place], [place] Phone: [phone] 2)  [facility], [id number] Phone: [phone] 3)  Dr. Yolanda Bonine at [facility] [address] Phone: [phone]     Mammogram A mammogram is an X-ray test to find changes in a woman's breast. You should get a mammogram if:  You are 21 years of age or older  You have risk factors.   Your doctor recommends that you have one.  BEFORE THE TEST  Do not schedule the test the week before your period, especially if your breasts are sore during this time.  On the day of your mammogram:  Wash your breasts and armpits well. After washing, do not put on any deodorant or talcum powder on until after your test.   Eat and drink as you usually do.   Take your medicines as usual.   If you are diabetic and take insulin, make sure you:   Eat before coming for your test.   Take your insulin as usual.   If you cannot keep your appointment, call before the appointment to cancel. Schedule another appointment.  TEST  You will need to undress from the waist up. You will put on a hospital gown.   Your breast will be put on the mammogram machine, and it will press firmly on your breast with a piece of plastic called a compression paddle. This will make your breast flatter so that the machine can X-ray all parts of your breast.   Both breasts will be X-rayed. Each breast will be X-rayed from above and from the side. An X-ray might need to be taken again if the picture is not good enough.   The mammogram will last  about 15 to 30 minutes.  AFTER THE TEST Finding out the results of your test Ask when your test results will be ready. Make sure you get your test results.  Document Released: 02/04/2009 Document Revised: 10/28/2011 Document Reviewed: 02/04/2009 [facility] Patient Information 2012 [place], [place].

## 2012-12-27 ENCOUNTER — Telehealth: Payer: Self-pay | Admitting: *Deleted

## 2012-12-27 LAB — URINALYSIS W MICROSCOPIC + REFLEX CULTURE
Glucose, UA: NEGATIVE mg/dL
Hgb urine dipstick: NEGATIVE
Leukocytes, UA: NEGATIVE
Protein, ur: NEGATIVE mg/dL

## 2012-12-27 LAB — VITAMIN D 25 HYDROXY (VIT D DEFICIENCY, FRACTURES): Vit D, 25-Hydroxy: 27 ng/mL — ABNORMAL LOW (ref 30–89)

## 2012-12-27 NOTE — Telephone Encounter (Signed)
Appt. On Feb 18 @ 1:40 pm. Pt informed with time and date.

## 2012-12-27 NOTE — Telephone Encounter (Signed)
Message copied by Aura Camps on Wed Dec 27, 2012  9:20 AM ------      Message from: Dara Lords      Created: Tue Dec 26, 2012  1:06 PM       Patient needs appointment with Dr. Rubye Beach in reference to persistent headaches.

## 2012-12-28 ENCOUNTER — Other Ambulatory Visit: Payer: Self-pay | Admitting: Gynecology

## 2012-12-28 DIAGNOSIS — E7800 Pure hypercholesterolemia, unspecified: Secondary | ICD-10-CM

## 2013-01-02 ENCOUNTER — Telehealth: Payer: Self-pay | Admitting: *Deleted

## 2013-01-02 DIAGNOSIS — R634 Abnormal weight loss: Secondary | ICD-10-CM

## 2013-01-02 NOTE — Telephone Encounter (Signed)
Great idea for the patient. Okay to set up consult with [facility].

## 2013-01-02 NOTE — Telephone Encounter (Signed)
Referral order placed, they will contact patient with appointment.

## 2013-01-02 NOTE — Telephone Encounter (Signed)
Pt would like referral consult to have a nutritious consult to help with weight loss. Pt was informed with her recent labs result and would to start working on her health. Okay to set consults?

## 2013-01-08 NOTE — Telephone Encounter (Signed)
Appt. 01/16/13 @ 8:00 a.m.

## 2013-01-16 ENCOUNTER — Encounter: Payer: Federal, State, Local not specified - PPO | Attending: Gynecology | Admitting: *Deleted

## 2013-01-16 ENCOUNTER — Encounter: Payer: Self-pay | Admitting: *Deleted

## 2013-01-16 DIAGNOSIS — Z713 Dietary counseling and surveillance: Secondary | ICD-10-CM | POA: Insufficient documentation

## 2013-01-16 NOTE — Patient Instructions (Addendum)
Goals:  Eat 3 meals/day, Avoid meal skipping   Increase protein rich foods  Choose more whole grains, lean protein, low-fat dairy, and fruits/non-starchy vegetables.   Aim for >30 min of physical activity daily  Limit sugar-sweetened beverages and concentrated sweets  Attend Bariatric Seminar at [facility] tonight (2/25).   Aim for (daily):  1400-1500 calories 175-190 g carbohydrates 85-95 g protein 38-42 g fat (<10-12 g as saturated fat)

## 2013-01-16 NOTE — Progress Notes (Signed)
  Medical Nutrition Therapy:  Appt start time: 0800 end time:  0930.  Assessment:  Obesity/Supervised Weight Loss Visit #1.  Patty Perkins is here today for help with weight loss.  States she is under extreme stress with job/living situation and overeats at meals.  Weight "jumps on me when I eat desserts", though she does not eat them all the time. Is currently traveling between [place] and [place] daily for her job and food choices are high calorie/Fat/CHO.  Recently joined the [employer] in [place] and has gone 5-6 times since joining. States she cannot do this by herself and inquires about bariatric surgery, specifically the LAGB. Discussed and answered her questions. Plans to attend the seminar at [facility].    MEDICATIONS:  Aleve (prn)   DIETARY INTAKE:   Usual eating pattern includes 2-3 meals and 0-1 snacks per day.  WHEN HOME B (AM): 4 pc reg bacon, scrambled eggs (2), 2 pcs white toast w/ butter; OJ or milk (16 oz)  Snk (AM): NONE  L (PM): Salad w/ veggies, croutons, and ranch dressing (1/2 c), 1 pc oatmeal cake w/ coconut & pecans, 2% milk (16 oz) Snk ( PM): NONE D ( PM): 3 Baked chicken wings, potato salad; Orange Hi-C (16 oz)   Snk ( PM): 1 pc oatmeal cake or another dessert  AT WORK IN [place] B (4 AM): [employer] SEC croissant  Snk (AM): NONE  L (9-10 AM):  Whopper w/ cheese; Grape Crush or sweet tea (16.9 oz) Snk (PM): NONE D (3 PM):  Taco Bell stuffed burrito OR Cookout "huge" cheeseburger   Snk (PM): NONE - sleeping  Usual physical activity: Goes to [facility] when in [place] on Wed-Fridays.  Estimated energy needs: 1400-1500 calories 175-190 g carbohydrates 85-95 g protein 38-42 g fat (<10-12g as saturated fat)  Progress Towards Goal(s):  In progress.   Nutritional Diagnosis:  [place]-3.3 Overweight/obesity related to poor food choices and sedentary lifestyle as evidenced by patient reported food recall and a BMI of 39.4 kg/m^2.    Intervention:  Nutrition education regarding better food choices,  exercise needs, and MyPlate. Discussed bariatric surgery, specifically the Lapband.  Handouts given during visit include:  Meal Planning Neurosurgeon and Wellness Services booklet  Monitoring/Evaluation:  Dietary intake, exercise, and body weight in 4 week(s).

## 2013-01-17 ENCOUNTER — Telehealth: Payer: Self-pay | Admitting: *Deleted

## 2013-01-17 NOTE — Telephone Encounter (Signed)
Pt went to see Nutrition consult and has decided to proceed with gastric sleeve surgery. Pt said she will need a letter of necessity stating why this proceed would help with her quality of life. She asked if you would please make letter sound good because this would determine if insurance company will pay. You may document information in this encounter and I can copy and set up a letter for you. Please advise

## 2013-01-18 ENCOUNTER — Encounter: Payer: Self-pay | Admitting: *Deleted

## 2013-01-18 NOTE — Telephone Encounter (Signed)
Patient has struggled with weight loss for years despite living an active lifestyle. She has attempted exercise and nutrition counseling without success. Her job requires her to be active which is becoming limited by her weight and due to having to travel frequently and eat out in restaurants her dietary choices are more difficult.  Her BMI over the past year has increased from 35 to 39.  She has issues with hypercholesterolemia and elevated LDL for which she will need to be treated with medication if she does not lose weight.  She also struggles with issues with her panniculus.  I think for the above reasons consideration for Lap Band -type surgery is reasonable if she decides to pursue that route.

## 2013-01-18 NOTE — Telephone Encounter (Signed)
Note faxed to Leandrew Koyanagi per pt request. [phone]

## 2013-01-19 ENCOUNTER — Other Ambulatory Visit: Payer: Self-pay | Admitting: Neurology

## 2013-01-19 DIAGNOSIS — R519 Headache, unspecified: Secondary | ICD-10-CM

## 2013-01-19 DIAGNOSIS — H9319 Tinnitus, unspecified ear: Secondary | ICD-10-CM

## 2013-01-22 ENCOUNTER — Encounter: Payer: Self-pay | Admitting: *Deleted

## 2013-01-30 ENCOUNTER — Other Ambulatory Visit: Payer: Federal, State, Local not specified - PPO

## 2013-01-31 ENCOUNTER — Ambulatory Visit (INDEPENDENT_AMBULATORY_CARE_PROVIDER_SITE_OTHER): Payer: Federal, State, Local not specified - PPO | Admitting: General Surgery

## 2013-01-31 ENCOUNTER — Encounter (INDEPENDENT_AMBULATORY_CARE_PROVIDER_SITE_OTHER): Payer: Self-pay | Admitting: General Surgery

## 2013-01-31 NOTE — Progress Notes (Signed)
Subjective:   morbid obesity  Patient ID: Patty Perkins, female   DOB: Sep 25, 1961, 52 y.o.   MRN: [id number]  HPI Patient is a very pleasant 52 year old female referred by Dr. Audie Box for consideration for surgical treatment for morbid obesity. The patient states that she has been overweight essentially her entire life. She has been through many many efforts at dieting and exercise be able to lose up to 45 pounds at a time but then just experiences progressive weight regain and additional weight. She does not feel that she is going to be do it on her own. She is for a concerned about her long-term health going forward. She does have chronic joint pain in her knees hip and back as well as plantar fasciitis. 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 She also has been found to have elevated cholesterol recently. She has been to our initial information seminar and has researched her options extensively and is interested in sleeve gastrectomy. She is a previous history of what sounds like esophageal dilatation by EGD some years ago and was told she had a small hiatal hernia but she currently has no symptoms of reflux and is on no medications for this.  Past Medical History  Diagnosis Date  . History of hiatal hernia   . Obesity    Past Surgical History  Procedure Laterality Date  . Abdominal hysterectomy  01/2010    TAH,RSO  . Hysteroscopy  2007    HYSTEROSCOPIC MYOMECTOMY  . Myomectomy    . Tonsillectomy and adenoidectomy    . Dental surgery      52 YO  . Exploratory laparotomy  07/22/2011    WITH EXT ADHESIOLYSIS, RESECTION OF LARGE PELVIC CYST, LSO AND PROCTOSCOPUY AT [facility]  . Oophorectomy      RSO W/TAH 01/2010--LSO W/ EXP LAP 07/22/11 AT [facility]   Current Outpatient Prescriptions  Medication Sig Dispense Refill  . naproxen sodium (ANAPROX) 220 MG tablet Take 220 mg by mouth as needed.       No current facility-administered medications for this visit.   No  Known Allergies History  Substance Use Topics  . Smoking status: Former Smoker    Quit date: 12/30/2004  . Smokeless tobacco: Never Used  . Alcohol Use: No     Review of Systems  Constitutional: Negative for fever and chills.  HENT: Negative.   Respiratory: Positive for shortness of breath. Negative for cough and chest tightness.   Cardiovascular: Positive for palpitations and leg swelling. Negative for chest pain.       The patient reports negative stress test some years ago when she had a syncopal episode but does not have a regular cardiologist  Gastrointestinal: Negative.   Genitourinary: Negative.   Psychiatric/Behavioral: Negative.        Objective:   Physical Exam BP 128/86  Pulse 71  Temp(Src) 97.5 F (36.4 C) (Temporal)  Resp 16  Ht 5\' 3"  (1.6 m)  Wt 228 lb 3.2 oz (103.511 kg)  BMI 40.43 kg/m2 General: Alert, morbidly obese African American female, in no distress Skin: Warm and dry without rash or infection. HEENT: No palpable masses or thyromegaly. Sclera nonicteric. Pupils equal round and reactive. Oropharynx clear. Lymph nodes: No cervical, supraclavicular, or inguinal nodes palpable. Lungs: Breath sounds clear and equal without increased work of breathing Cardiovascular: Regular rate and rhythm without murmur. No JVD or edema. Peripheral pulses intact. Abdomen: Nondistended. Soft and nontender. No masses palpable. No organomegaly.  No palpable hernias. Extremities: No edema or joint swelling or deformity. No chronic venous stasis changes. Neurologic: Alert and fully oriented. Gait normal.    Assessment:     52 year old female with progressive morbid obesity unresponsive to multiple efforts at medical management. She has comorbidities of chronic joint pain and elevated cholesterol. She certainly would have significant potential medical benefits to get her weight down to a healthier level. We discussed surgical options available including lap band, gastric sleeve  and gastric bypass. After our discussion she feels that she would like to proceed with gastric sleeve as it does not required extensive followup or foreign material of a lap band it is somewhat less extensive in regards to rerouting her intestinal tract then the bypass. I believe she would be a good candidate. We will go ahead with preoperative psych eval. She is already seen our nutritionist. Will get upper GI series and routine lab work. I will see her back following these studies.    Plan:     Preoperative workup for sleep gastrectomy as above

## 2013-02-01 ENCOUNTER — Other Ambulatory Visit (INDEPENDENT_AMBULATORY_CARE_PROVIDER_SITE_OTHER): Payer: Self-pay

## 2013-02-06 ENCOUNTER — Ambulatory Visit (HOSPITAL_COMMUNITY)
Admission: RE | Admit: 2013-02-06 | Discharge: 2013-02-06 | Disposition: A | Discharge status: Home / Self Care | Payer: Federal, State, Local not specified - PPO | Source: Ambulatory Visit | Attending: General Surgery | Admitting: General Surgery

## 2013-02-06 ENCOUNTER — Other Ambulatory Visit: Payer: Self-pay

## 2013-02-06 DIAGNOSIS — E7800 Pure hypercholesterolemia, unspecified: Secondary | ICD-10-CM | POA: Insufficient documentation

## 2013-02-06 DIAGNOSIS — M722 Plantar fascial fibromatosis: Secondary | ICD-10-CM | POA: Insufficient documentation

## 2013-02-06 DIAGNOSIS — M25569 Pain in unspecified knee: Secondary | ICD-10-CM | POA: Insufficient documentation

## 2013-02-06 DIAGNOSIS — Z6841 Body Mass Index (BMI) 40.0 and over, adult: Secondary | ICD-10-CM | POA: Insufficient documentation

## 2013-02-06 DIAGNOSIS — K449 Diaphragmatic hernia without obstruction or gangrene: Secondary | ICD-10-CM | POA: Insufficient documentation

## 2013-02-06 LAB — CBC WITH DIFFERENTIAL/PLATELET
Basophils Absolute: 0 10*3/uL (ref 0.0–0.1)
Basophils Relative: 0 % (ref 0–1)
MCHC: 33.3 g/dL (ref 30.0–36.0)
Monocytes Absolute: 0.5 10*3/uL (ref 0.1–1.0)
Neutro Abs: 4.7 10*3/uL (ref 1.7–7.7)
Neutrophils Relative %: 62 % (ref 43–77)
Platelets: 387 10*3/uL (ref 150–400)
RDW: 15 % (ref 11.5–15.5)

## 2013-02-06 LAB — COMPREHENSIVE METABOLIC PANEL
ALT: 40 U/L — ABNORMAL HIGH (ref 0–35)
AST: 26 U/L (ref 0–37)
Albumin: 4.5 g/dL (ref 3.5–5.2)
Alkaline Phosphatase: 158 U/L — ABNORMAL HIGH (ref 39–117)
BUN: 10 mg/dL (ref 6–23)
Potassium: 4.5 mEq/L (ref 3.5–5.3)
Sodium: 137 mEq/L (ref 135–145)

## 2013-02-06 LAB — LIPID PANEL
Cholesterol: 233 mg/dL — ABNORMAL HIGH (ref 0–200)
Total CHOL/HDL Ratio: 3.8 Ratio
VLDL: 31 mg/dL (ref 0–40)

## 2013-02-13 ENCOUNTER — Ambulatory Visit: Payer: Federal, State, Local not specified - PPO | Admitting: *Deleted

## 2013-02-19 ENCOUNTER — Encounter: Payer: Self-pay | Admitting: *Deleted

## 2013-02-19 ENCOUNTER — Encounter: Payer: Federal, State, Local not specified - PPO | Attending: Gynecology | Admitting: *Deleted

## 2013-02-19 DIAGNOSIS — Z713 Dietary counseling and surveillance: Secondary | ICD-10-CM | POA: Insufficient documentation

## 2013-02-19 NOTE — Progress Notes (Addendum)
  Medical Nutrition Therapy:  Appt start time: 0930 end time:  1000.  Assessment:  Obesity/Supervised Weight Loss Visit #2 of 4.  Has not made many changes to dietary intake, though states she is trying to decrease sweetened drinks.  [facility].  Attended bariatric seminar last month and is having LAGB.     MEDICATIONS:  Aleve (prn)   DIETARY INTAKE:  No changes from last visit. Usual eating pattern includes 2-3 meals and 0-1 snacks per day.  WHEN HOME B (AM): 4 pc reg bacon, scrambled eggs (2), 2 pcs white toast w/ butter; OJ or milk (16 oz)  Snk (AM): NONE  L (PM): Salad w/ veggies, croutons, and ranch dressing (1/2 c), 1 pc oatmeal cake w/ coconut & pecans, 2% milk (16 oz) Snk ( PM): NONE D ( PM): 3 Baked chicken wings, potato salad; Orange Hi-C (16 oz)   Snk ( PM): 1 pc oatmeal cake or another dessert  AT [place] IN [place] B (4 AM): [employer] SEC croissant  Snk (AM): NONE  L (9-10 AM):  Whopper w/ cheese; Grape Crush or sweet tea (16.9 oz) Snk (PM): NONE D (3 PM):  Taco Bell stuffed burrito OR Cookout "huge" cheeseburger   Snk (PM): NONE - sleeping  Usual physical activity: No exercise except [place]  Estimated energy needs: 1400-1500 calories 175-190 g carbohydrates 85-95 g protein 38-42 g fat (<10-12g as saturated fat)  Progress Towards Goal(s):  In progress.   Nutritional Diagnosis:  [place]-3.3 Overweight/obesity related to past poor dietary habits and physical inactivity as evidenced by patient attending supervised weight loss for bariatric surgery.    Intervention:  Nutrition education regarding better food choices, exercise needs, and MyPlate. Discussed resuming exercise to help lower cholesterol and high blood pressure.   Monitoring/Evaluation:  Dietary intake, exercise, and body weight in 4 week(s).

## 2013-02-19 NOTE — Patient Instructions (Addendum)
Goals:  Eat 3 meals/day, Avoid meal skipping   Increase protein rich foods  Choose more whole grains, lean protein, low-fat dairy, and fruits/non-starchy vegetables.   Aim for >30 min of physical activity daily  Limit sugar-sweetened beverages and concentrated sweets  Aim for (daily):  1400-1500 calories 175-190 g carbohydrates 85-95 g protein 38-42 g fat (<10-12 g as saturated fat)

## 2013-03-02 ENCOUNTER — Ambulatory Visit: Payer: Federal, State, Local not specified - PPO | Admitting: *Deleted

## 2013-03-15 ENCOUNTER — Ambulatory Visit: Payer: Federal, State, Local not specified - PPO

## 2013-03-20 ENCOUNTER — Encounter: Payer: Federal, State, Local not specified - PPO | Attending: Gynecology | Admitting: *Deleted

## 2013-03-20 ENCOUNTER — Encounter: Payer: Self-pay | Admitting: *Deleted

## 2013-03-20 DIAGNOSIS — Z713 Dietary counseling and surveillance: Secondary | ICD-10-CM | POA: Insufficient documentation

## 2013-03-20 NOTE — Progress Notes (Signed)
  Medical Nutrition Therapy:  Appt start time: 0920   End time:  0950.  Primary Concerns Today: Obesity/Supervised Weight Loss Visit   MEDICATIONS:  Aleve (prn)   DIETARY INTAKE:  No changes from last visit. Usual eating pattern includes 2-3 meals and 0-1 snacks per day.  Usual physical activity:  No exercise except [place]  Estimated energy needs: 1400-1500 calories 175-190 g carbohydrates 85-95 g protein 38-42 g fat (<10-12g as saturated fat)  Progress Towards Goal(s):  In progress.   Nutritional Diagnosis:  [place]-3.3 Overweight/obesity related to past poor dietary habits and physical inactivity as evidenced by patient attending supervised weight loss for bariatric surgery.    Intervention:  Continued nutrition education regarding better food choices, exercise needs, and MyPlate. Discussed lower sodium seasoning options and increasing exercise.   Monitoring/Evaluation:  Dietary intake, exercise, and body weight in 4 week(s).

## 2013-03-20 NOTE — Patient Instructions (Addendum)
Goals:  Eat 3 meals/day, Avoid meal skipping   Increase protein rich foods  Choose more whole grains, lean protein, low-fat dairy, and fruits/non-starchy vegetables.   Aim for >30 min of physical activity daily  Limit sugar-sweetened beverages and concentrated sweets  Aim for (daily):  1400-1500 calories 175-190 g carbohydrates 85-95 g protein 38-42 g fat (<10-12 g as saturated fat)  

## 2013-04-20 ENCOUNTER — Encounter: Payer: Self-pay | Admitting: *Deleted

## 2013-04-20 ENCOUNTER — Encounter: Payer: Federal, State, Local not specified - PPO | Attending: Gynecology | Admitting: *Deleted

## 2013-04-20 DIAGNOSIS — Z713 Dietary counseling and surveillance: Secondary | ICD-10-CM | POA: Insufficient documentation

## 2013-04-20 NOTE — Patient Instructions (Addendum)
   Follow Pre-Op Nutrition Goals to prepare for Sleeve Gastrectomy Surgery.   Call the [facility] at [phone] once you have been given your surgery date to enrolled in the Pre-Op Nutrition Class. You will need to attend this nutrition class 3-4 weeks prior to your surgery.  

## 2013-04-20 NOTE — Progress Notes (Signed)
  Pre-Op Assessment Visit:  Pre-Operative Sleeve Gastrectomy Surgery/Supervised Weight Loss  Medical Nutrition Therapy:  Appt start time: 0930   End time:  1000.  Patient was seen on 04/20/13 for Pre-Operative Sleeve Gastrectomy Nutrition Assessment. Assessment and letter of approval faxed to [facility] Bariatric Surgery Program coordinator on 04/23/13.  Approval letter sent to [facility] center and will be available in the chart under the media tab.  MEDICATIONS:  Aleve (prn)   DIETARY INTAKE:  No changes from last visit. Usual eating pattern includes 2-3 meals and 0-1 snacks per day.  Usual physical activity:  Increased exercise a little; [place]   Estimated energy needs: 1400-1500 calories 175-190 g carbohydrates 85-95 g protein 38-42 g fat (<10-12g as saturated fat)  Progress Towards Goal(s):  In progress.   Nutritional Diagnosis:  [place]-3.3 Overweight/obesity related to past poor dietary habits and physical inactivity as evidenced by patient attending supervised weight loss for bariatric surgery.    Intervention:  Continued nutrition education regarding better food choices, exercise needs, and bariatric surgery pre-op goals.  Handouts given during visit include:  Pre-Op Goals   Bariatric Surgery Protein Shakes  Plan: Patient to call for Pre-Op and Post-Op Nutrition Education at the [facility] when surgery is scheduled.

## 2013-07-19 ENCOUNTER — Ambulatory Visit (INDEPENDENT_AMBULATORY_CARE_PROVIDER_SITE_OTHER): Payer: Federal, State, Local not specified - PPO | Admitting: Gynecology

## 2013-07-19 ENCOUNTER — Encounter: Payer: Self-pay | Admitting: Gynecology

## 2013-07-19 VITALS — BP 134/84 | Ht 63.0 in | Wt 238.0 lb

## 2013-07-19 DIAGNOSIS — R350 Frequency of micturition: Secondary | ICD-10-CM

## 2013-07-19 DIAGNOSIS — R635 Abnormal weight gain: Secondary | ICD-10-CM

## 2013-07-19 DIAGNOSIS — R5383 Other fatigue: Secondary | ICD-10-CM

## 2013-07-19 DIAGNOSIS — Z13220 Encounter for screening for lipoid disorders: Secondary | ICD-10-CM

## 2013-07-19 DIAGNOSIS — R631 Polydipsia: Secondary | ICD-10-CM

## 2013-07-19 DIAGNOSIS — R5381 Other malaise: Secondary | ICD-10-CM

## 2013-07-19 LAB — CBC WITH DIFFERENTIAL/PLATELET
Basophils Absolute: 0 10*3/uL (ref 0.0–0.1)
Basophils Relative: 0 % (ref 0–1)
Lymphocytes Relative: 24 % (ref 12–46)
Neutro Abs: 6.9 10*3/uL (ref 1.7–7.7)
Neutrophils Relative %: 67 % (ref 43–77)
Platelets: 370 10*3/uL (ref 150–400)
RDW: 15.2 % (ref 11.5–15.5)
WBC: 10.2 10*3/uL (ref 4.0–10.5)

## 2013-07-19 LAB — COMPREHENSIVE METABOLIC PANEL
ALT: 46 U/L — ABNORMAL HIGH (ref 0–35)
AST: 28 U/L (ref 0–37)
Albumin: 4.3 g/dL (ref 3.5–5.2)
Alkaline Phosphatase: 148 U/L — ABNORMAL HIGH (ref 39–117)
Calcium: 9.7 mg/dL (ref 8.4–10.5)
Chloride: 102 mEq/L (ref 96–112)
Potassium: 4 mEq/L (ref 3.5–5.3)

## 2013-07-19 LAB — LIPID PANEL
LDL Cholesterol: 156 mg/dL — ABNORMAL HIGH (ref 0–99)
Total CHOL/HDL Ratio: 4.1 Ratio
Triglycerides: 186 mg/dL — ABNORMAL HIGH (ref <150)
VLDL: 37 mg/dL (ref 0–40)

## 2013-07-19 NOTE — Patient Instructions (Signed)
Follow up for lab work

## 2013-07-19 NOTE — Progress Notes (Signed)
Patient presents complaining of several issues all non-gynecologic. She's noting increasing fatigue, excessive thirst, some weight gain and urinary frequency. She is scheduled for gastric bypass surgery in November. Does have a family history of hypercholesterolemia and hypertension with cardiovascular disease. No hair or skin changes. No neurologic symptoms such as dizziness lightheaded blurred vision. No chest pain cough sputum. No peripheral edema.  No diarrhea constipation. Does have a primary physician but wanted to followup with me. I reviewed with her that I would refer her for any abnormalities but I would be glad to at least examine her and do some basic lab work today.  Exam Blood pressure 134/84, pulse 80, BMI 42 Skin grossly normal without evidence of rash or lesions HEENT normal without evidence of thyroid enlargement or nodularity. No evidence of cervical adenopathy Lungs are clear bilaterally Cardiac regular rate without rubs murmurs or gallops Abdomen obese, soft without gross masses, tenderness, organomegaly or hernia Extremities without deformity or evidence of edema  Assessment and plan: Symptoms as noted above. Will check baseline CBC comprehensive metabolic panel hemoglobin A1c lipid profile vitamin D TSH and urinalysis. Patient will followup for lab results. Otherwise will follow up with her surgeons for upcoming surgery.

## 2013-07-20 LAB — HEMOGLOBIN A1C: Hgb A1c MFr Bld: 6.2 % — ABNORMAL HIGH (ref <5.7)

## 2013-07-20 LAB — URINALYSIS W MICROSCOPIC + REFLEX CULTURE
Bacteria, UA: NONE SEEN
Bilirubin Urine: NEGATIVE
Hgb urine dipstick: NEGATIVE
Ketones, ur: NEGATIVE mg/dL
Protein, ur: NEGATIVE mg/dL
Urobilinogen, UA: 0.2 mg/dL (ref 0.0–1.0)

## 2013-07-24 ENCOUNTER — Other Ambulatory Visit: Payer: Self-pay | Admitting: Gynecology

## 2013-07-24 ENCOUNTER — Encounter: Payer: Self-pay | Admitting: Gynecology

## 2013-07-24 ENCOUNTER — Telehealth: Payer: Self-pay

## 2013-07-24 DIAGNOSIS — R7989 Other specified abnormal findings of blood chemistry: Secondary | ICD-10-CM

## 2013-07-24 NOTE — Telephone Encounter (Signed)
Letter of support provided

## 2013-07-24 NOTE — Telephone Encounter (Signed)
Patient called stating that she received denial letter from ins co for her weight loss surgery.  She said that the ins co needs two things from you.   (1) Proof stating that she has been morbidly obese (BMI 40>) for 2 years OR BMI 35> along with two co-morbidities x 2 years. (At her last CE BMI was less that 35 and in the past it looks like right at 35 but she was not on meds and I did not note co-morbidities.)  (2) Chart notes showing what she has tried to lose weight in the last year.  (Pt. Said the only thing she tried was B12 shots and she hoped you put that in your note. I did not see it.)  Patient said that the surgeon's office stressed that she should come in to our office and sit down with someone and go through her chart together to look for this info.   I tried to explain that we do not really have time for this and we can look the chart for her and glean this info.  Paper chart is on your desk. Please advise. Thanks

## 2013-07-27 ENCOUNTER — Other Ambulatory Visit: Payer: Federal, State, Local not specified - PPO

## 2013-07-27 DIAGNOSIS — R7989 Other specified abnormal findings of blood chemistry: Secondary | ICD-10-CM

## 2013-07-27 LAB — COMPREHENSIVE METABOLIC PANEL
ALT: 26 U/L (ref 0–35)
AST: 20 U/L (ref 0–37)
Albumin: 4.1 g/dL (ref 3.5–5.2)
Alkaline Phosphatase: 119 U/L — ABNORMAL HIGH (ref 39–117)
Calcium: 9.3 mg/dL (ref 8.4–10.5)
Chloride: 104 mEq/L (ref 96–112)
Potassium: 3.8 mEq/L (ref 3.5–5.3)
Sodium: 141 mEq/L (ref 135–145)

## 2013-08-10 ENCOUNTER — Telehealth: Payer: Self-pay | Admitting: *Deleted

## 2013-08-10 NOTE — Telephone Encounter (Signed)
Pt would like a name of a PCP to get established to help her with her non Gyn health. She values you opinion for a good physician. Please advise

## 2013-08-11 NOTE — Telephone Encounter (Signed)
[facility].  No specific MD

## 2013-08-13 NOTE — Telephone Encounter (Signed)
Pt informed with the below note. 

## 2014-07-30 ENCOUNTER — Telehealth: Payer: Self-pay

## 2014-07-30 NOTE — Telephone Encounter (Signed)
I am not sure what she needs, but as her gynecologist I don't think I am the one to refer her as I have not been treating her for this.

## 2014-07-30 NOTE — Telephone Encounter (Signed)
Patient called requesting that you give her referral to Dr. Heide Guile. [place] at "[facility]" .  She said this is pertaining to her hiatal hernia and she is "going to be able to have it removed but they need my medical records" and someone from the doctor's office to call them.  I looked back through your notes for the last several years and did not see mention of hiatal hernia.

## 2014-07-30 NOTE — Telephone Encounter (Signed)
Left message to call.

## 2014-07-31 ENCOUNTER — Encounter: Payer: Self-pay | Admitting: Internal Medicine

## 2014-07-31 NOTE — Telephone Encounter (Signed)
Patient informed. Upon review of her chart she had seen Dr. Carlean Purl back in 2004.  She said she will call him for appt.

## 2014-09-23 ENCOUNTER — Encounter: Payer: Self-pay | Admitting: Gynecology

## 2014-09-30 ENCOUNTER — Ambulatory Visit: Payer: Federal, State, Local not specified - PPO | Admitting: Internal Medicine

## 2017-03-07 ENCOUNTER — Telehealth: Payer: Self-pay

## 2017-03-07 NOTE — Telephone Encounter (Signed)
Left message with name/# in her voice mail.

## 2017-03-07 NOTE — Telephone Encounter (Signed)
I personally see Dr. Garret Reddish

## 2017-03-07 NOTE — Telephone Encounter (Signed)
Patient wants recommendation from you for a PCP who practices medicine like you do and who is as wonderful as you are.

## 2017-09-26 DIAGNOSIS — M25562 Pain in left knee: Secondary | ICD-10-CM | POA: Diagnosis not present

## 2017-10-01 DIAGNOSIS — M25562 Pain in left knee: Secondary | ICD-10-CM | POA: Diagnosis not present

## 2017-10-03 DIAGNOSIS — M25562 Pain in left knee: Secondary | ICD-10-CM | POA: Diagnosis not present

## 2017-10-05 DIAGNOSIS — H9203 Otalgia, bilateral: Secondary | ICD-10-CM | POA: Diagnosis not present

## 2017-10-05 DIAGNOSIS — J0100 Acute maxillary sinusitis, unspecified: Secondary | ICD-10-CM | POA: Diagnosis not present

## 2017-10-05 DIAGNOSIS — R05 Cough: Secondary | ICD-10-CM | POA: Diagnosis not present

## 2017-11-09 DIAGNOSIS — Y999 Unspecified external cause status: Secondary | ICD-10-CM | POA: Diagnosis not present

## 2017-11-09 DIAGNOSIS — G8918 Other acute postprocedural pain: Secondary | ICD-10-CM | POA: Diagnosis not present

## 2017-11-09 DIAGNOSIS — S83242A Other tear of medial meniscus, current injury, left knee, initial encounter: Secondary | ICD-10-CM | POA: Diagnosis not present

## 2017-11-09 DIAGNOSIS — S83282A Other tear of lateral meniscus, current injury, left knee, initial encounter: Secondary | ICD-10-CM | POA: Diagnosis not present

## 2017-11-09 DIAGNOSIS — S83232A Complex tear of medial meniscus, current injury, left knee, initial encounter: Secondary | ICD-10-CM | POA: Diagnosis not present

## 2017-11-09 DIAGNOSIS — M1712 Unilateral primary osteoarthritis, left knee: Secondary | ICD-10-CM | POA: Diagnosis not present

## 2017-11-17 DIAGNOSIS — S83282D Other tear of lateral meniscus, current injury, left knee, subsequent encounter: Secondary | ICD-10-CM | POA: Diagnosis not present

## 2017-11-17 DIAGNOSIS — S83242D Other tear of medial meniscus, current injury, left knee, subsequent encounter: Secondary | ICD-10-CM | POA: Diagnosis not present

## 2017-11-21 DIAGNOSIS — M25562 Pain in left knee: Secondary | ICD-10-CM | POA: Diagnosis not present

## 2017-11-23 DIAGNOSIS — M25562 Pain in left knee: Secondary | ICD-10-CM | POA: Diagnosis not present

## 2017-12-17 DIAGNOSIS — M549 Dorsalgia, unspecified: Secondary | ICD-10-CM | POA: Diagnosis not present

## 2017-12-19 DIAGNOSIS — S83282D Other tear of lateral meniscus, current injury, left knee, subsequent encounter: Secondary | ICD-10-CM | POA: Diagnosis not present

## 2017-12-19 DIAGNOSIS — S83242D Other tear of medial meniscus, current injury, left knee, subsequent encounter: Secondary | ICD-10-CM | POA: Diagnosis not present

## 2018-02-20 ENCOUNTER — Ambulatory Visit: Payer: Federal, State, Local not specified - PPO | Admitting: Family Medicine

## 2018-02-20 ENCOUNTER — Encounter: Payer: Self-pay | Admitting: Family Medicine

## 2018-02-20 VITALS — BP 128/88 | HR 88 | Temp 98.5°F | Ht 63.75 in | Wt 207.4 lb

## 2018-02-20 DIAGNOSIS — Z23 Encounter for immunization: Secondary | ICD-10-CM

## 2018-02-20 DIAGNOSIS — R739 Hyperglycemia, unspecified: Secondary | ICD-10-CM | POA: Insufficient documentation

## 2018-02-20 DIAGNOSIS — E785 Hyperlipidemia, unspecified: Secondary | ICD-10-CM | POA: Diagnosis not present

## 2018-02-20 DIAGNOSIS — J301 Allergic rhinitis due to pollen: Secondary | ICD-10-CM | POA: Diagnosis not present

## 2018-02-20 DIAGNOSIS — Z1159 Encounter for screening for other viral diseases: Secondary | ICD-10-CM

## 2018-02-20 DIAGNOSIS — D1779 Benign lipomatous neoplasm of other sites: Secondary | ICD-10-CM | POA: Diagnosis not present

## 2018-02-20 DIAGNOSIS — E669 Obesity, unspecified: Secondary | ICD-10-CM | POA: Insufficient documentation

## 2018-02-20 DIAGNOSIS — Z114 Encounter for screening for human immunodeficiency virus [HIV]: Secondary | ICD-10-CM

## 2018-02-20 DIAGNOSIS — J309 Allergic rhinitis, unspecified: Secondary | ICD-10-CM | POA: Insufficient documentation

## 2018-02-20 DIAGNOSIS — D179 Benign lipomatous neoplasm, unspecified: Secondary | ICD-10-CM | POA: Insufficient documentation

## 2018-02-20 DIAGNOSIS — Z87891 Personal history of nicotine dependence: Secondary | ICD-10-CM | POA: Insufficient documentation

## 2018-02-20 HISTORY — DX: Hyperlipidemia, unspecified: E78.5

## 2018-02-20 NOTE — Progress Notes (Signed)
Phone: [phone]  Subjective:  Patient presents today to establish care.  Prior patient of Dr. Phineas Real only for OBGYN- she was advised to get family physician as well. Chief complaint-noted.   See problem oriented charting  The following were reviewed and entered/updated in epic: Past Medical History:  Diagnosis Date  . Allergic rhinitis    pollen- neti pots  . Chicken pox   . Hiatal hernia    no issues per patient  . Hyperlipidemia 02/20/2018  . Obesity    Patient Active Problem List   Diagnosis Date Noted  . Hyperlipidemia 02/20/2018    Priority: Medium  . Hyperglycemia 02/20/2018    Priority: Medium  . Former smoker 02/20/2018    Priority: Low  . Allergic rhinitis     Priority: Low  . History of hiatal hernia     Priority: Low  . Lipoma 02/20/2018   Past Surgical History:  Procedure Laterality Date  . ABDOMINAL HYSTERECTOMY  01/2010   TAH,RSO. large fibroids  . DENTAL SURGERY     57 yo- wisdom teeth  . EXPLORATORY LAPAROTOMY  07/22/2011   WITH EXT ADHESIOLYSIS, RESECTION OF LARGE PELVIC CYST, LSO AND PROCTOSCOPUY AT [facility]  . HYSTEROSCOPY  2007   HYSTEROSCOPIC MYOMECTOMY. states 2 removals then later hystrectomy  . MENISCUS REPAIR     R in [place]. later left in [place]  . MYOMECTOMY    . OOPHORECTOMY     one side. RSO W/TAH 01/2010--LSO W/ EXP LAP 07/22/11 AT [facility]  . TONSILLECTOMY AND ADENOIDECTOMY      Family History  Problem Relation Age of Onset  . Hypertension Mother   . Hyperlipidemia Mother   . Heart disease Mother        Stent age 69. defibrillator.   . Ovarian cancer Sister        Half sister  . Liver cancer Brother   . Heart disease Brother        mid 52s- first stent  . Diabetes Brother   . Hypertension Brother   . Hyperlipidemia Brother   . Prostate cancer Father   . Heart attack Maternal Grandmother        88  . Stroke Maternal Grandfather     Medications- reviewed and updated Current Outpatient Medications  Medication Sig Dispense  Refill  . Ascorbic Acid (VITAMIN C) 1000 MG tablet Take 1,000 mg by mouth daily.    Marland Kitchen b complex vitamins capsule Take 1 capsule by mouth daily.    . cholecalciferol (VITAMIN D) 400 units TABS tablet Take 400 Units by mouth.    . vitamin A 10000 UNIT capsule Take 10,000 Units by mouth daily.    . Zinc 25 MG TABS Take 1 tablet by mouth daily.     No current facility-administered medications for this visit.     Allergies-reviewed and updated No Known Allergies  Social History   Social History Narrative   Divorced. No children. Fransisco Beau terrier.    Mother lives with her- she is the caregiver      Work in [place] - [place] at [place]. 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    Loves her job   Control and instrumentation engineer- history/english.       Hobbies: TV nut- enjoys old movies    ROS--Full ROS was completed and negative except for lump on right hip, weight loss but intentional ROS  Objective: BP 128/88 (BP Location: Left Arm, Patient Position: Sitting, Cuff Size: Large)   Pulse 88  Temp 98.5 F (36.9 C) (Oral)   Ht 5' 3.75" (1.619 m)   Wt 207 lb 6.4 oz (94.1 kg)   SpO2 93%   BMI 35.88 kg/m  Gen: NAD, resting comfortably HEENT: Mucous membranes are moist. Oropharynx normal. TM normal. Eyes: sclera and lids normal, PERRLA Neck: no thyromegaly, no cervical lymphadenopathy CV: RRR no murmurs rubs or gallops Lungs: CTAB no crackles, wheeze, rhonchi Abdomen: soft/nontender/nondistended/normal bowel sounds. No rebound or guarding. overweight Ext: no edema Skin: warm, dry, no rash On low back/upper right hip there is a 2-3 x 2-3 cm mobile subcutaneous soft lesion without obvious head to it Neuro: 5/5 strength in upper and lower extremities, normal gait  Assessment/Plan:  Prefers to do things without medicine- down 40 lbs from peak.  Health Maintenance Due  Topic Date Due  . Hepatitis C Screening - with bloodwork July 18, 1961  . HIV  Screening - with bloodwork 07/05/1975  . TETANUS/TDAP - today 07/05/1979  . MAMMOGRAM - please get this and have them send [place] records 07/04/2010  . COLONOSCOPY - Sign release of information at the check out desk for this 08/28/2013  . PAP SMEAR -  When will you be seeing Dr. Phineas Real for this? Patient updates me has not seen since 2014 so I advised follow up 12/27/2015   Hyperglycemia Obesity S: had been as high as 240 on weight after hysterectomy then got down to the 220s. She is now down to 207 (201 on home scales)  College weight was as low as 150s. With atkins in past got to 171. Her target is to get back into 160s.   She has cut down on carbs and dairy. Mainly meats and veggies -some fruits. 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   Occasional numbness/tinging in left 5th toe- can get some burning from time to time. She wonders if this could be due to diabtes A/P: obesity and hyperglycemia obviously related here. Hopeful she has reduced her risks. Will return for fasting a1c and cbg   Lipoma S: lump on right hip for at least a year. States it is soft. States not growing.   Also has a spot on her left lower leg. Glass into her. 15 years ago. No tchanging A/P: this appears to be a lipoma. Possible deep cyst but more likely lipoma.   Hyperlipidemia S: previously poorly controlled on no medicine Lab Results  Component Value Date   CHOL 256 (H) 07/19/2013   HDL 63 07/19/2013   LDLCALC 156 (H) 07/19/2013   TRIG 186 (H) 07/19/2013   CHOLHDL 4.1 07/19/2013   A/P: update lipids- hopeful improved with weight loss   Future Appointments  Date Time Provider [place]  02/27/2018  9:30 AM [facility]    Lab/Order associations: Screening for HIV (human immunodeficiency virus) - Plan: HIV antibody  Hyperlipidemia, unspecified hyperlipidemia type - Plan: Lipid panel, CBC, Comprehensive metabolic panel, POCT Urinalysis Dipstick (Automated)  Hyperglycemia - Plan:  Hemoglobin A1c  Former smoker - Plan: POCT Urinalysis Dipstick (Automated)  Encounter for HCV screening test for low risk patient - Plan: Hepatitis C antibody  Need for prophylactic vaccination with combined diphtheria-tetanus-pertussis (DTP) vaccine - Plan: Tdap vaccine greater than or equal to 7yo IM  Return precautions advised. Garret Reddish, MD

## 2018-02-20 NOTE — Assessment & Plan Note (Signed)
S: lump on right hip for at least a year. States it is soft. States not growing.   Also has a spot on her left lower leg. Glass into her. 15 years ago. No tchanging A/P: this appears to be a lipoma. Possible deep cyst but more likely lipoma.

## 2018-02-20 NOTE — Assessment & Plan Note (Signed)
S: previously poorly controlled on no medicine Lab Results  Component Value Date   CHOL 256 (H) 07/19/2013   HDL 63 07/19/2013   LDLCALC 156 (H) 07/19/2013   TRIG 186 (H) 07/19/2013   CHOLHDL 4.1 07/19/2013   A/P: update lipids- hopeful improved with weight loss

## 2018-02-20 NOTE — Patient Instructions (Addendum)
Schedule a lab visit at the check out desk within 2 weeks. Return for future fasting labs meaning nothing but water after midnight please. Ok to take your medications with water.   Great job with weight loss through healthy eating and regular exercise- keep up the great work- love your under 170 goal.   I am hopeful with changes you have avoided developing diabetes- we will test for that today. Also hope cholesterol has improved.   Health Maintenance Due  Topic Date Due  . Hepatitis C Screening - with bloodwork 16-Apr-1961  . HIV Screening - with bloodwork 07/05/1975  . TETANUS/TDAP - today 07/05/1979  . MAMMOGRAM - please get this and have them send [place] records 07/04/2010  . COLONOSCOPY - Sign release of information at the check out desk for this 08/28/2013  . PAP SMEAR -  When will you be seeing Dr. Phineas Real for this? Patient updates me has not seen since 2014 so I advised follow up 12/27/2015

## 2018-02-20 NOTE — Assessment & Plan Note (Addendum)
Obesity S: had been as high as 240 on weight after hysterectomy then got down to the 220s. She is now down to 207 (201 on home scales)  College weight was as low as 150s. With atkins in past got to 171. Her target is to get back into 160s.   She has cut down on carbs and dairy. Mainly meats and veggies -some fruits. 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   Occasional numbness/tinging in left 5th toe- can get some burning from time to time. She wonders if this could be due to diabtes A/P: obesity and hyperglycemia obviously related here. Hopeful she has reduced her risks. Will return for fasting a1c and cbg

## 2018-02-27 ENCOUNTER — Other Ambulatory Visit (INDEPENDENT_AMBULATORY_CARE_PROVIDER_SITE_OTHER): Payer: Federal, State, Local not specified - PPO

## 2018-02-27 DIAGNOSIS — R739 Hyperglycemia, unspecified: Secondary | ICD-10-CM | POA: Diagnosis not present

## 2018-02-27 DIAGNOSIS — Z114 Encounter for screening for human immunodeficiency virus [HIV]: Secondary | ICD-10-CM

## 2018-02-27 DIAGNOSIS — Z87891 Personal history of nicotine dependence: Secondary | ICD-10-CM | POA: Diagnosis not present

## 2018-02-27 DIAGNOSIS — E785 Hyperlipidemia, unspecified: Secondary | ICD-10-CM

## 2018-02-27 DIAGNOSIS — Z1159 Encounter for screening for other viral diseases: Secondary | ICD-10-CM

## 2018-02-27 LAB — CBC
HCT: 38.9 % (ref 36.0–46.0)
Hemoglobin: 12.6 g/dL (ref 12.0–15.0)
MCHC: 32.5 g/dL (ref 30.0–36.0)
MCV: 86.7 fl (ref 78.0–100.0)
Platelets: 339 10*3/uL (ref 150.0–400.0)
RBC: 4.48 Mil/uL (ref 3.87–5.11)
RDW: 15.5 % (ref 11.5–15.5)
WBC: 7.1 10*3/uL (ref 4.0–10.5)

## 2018-02-27 LAB — COMPREHENSIVE METABOLIC PANEL
ALK PHOS: 180 U/L — AB (ref 39–117)
ALT: 53 U/L — AB (ref 0–35)
AST: 26 U/L (ref 0–37)
Albumin: 4 g/dL (ref 3.5–5.2)
BILIRUBIN TOTAL: 0.4 mg/dL (ref 0.2–1.2)
BUN: 8 mg/dL (ref 6–23)
CO2: 30 meq/L (ref 19–32)
Calcium: 9.5 mg/dL (ref 8.4–10.5)
Chloride: 103 mEq/L (ref 96–112)
Creatinine, Ser: 0.59 mg/dL (ref 0.40–1.20)
GFR: 134.8 mL/min (ref >60.00)
GLUCOSE: 96 mg/dL (ref 70–99)
Potassium: 4.3 mEq/L (ref 3.5–5.1)
SODIUM: 140 meq/L (ref 135–145)
TOTAL PROTEIN: 7 g/dL (ref 6.0–8.3)

## 2018-02-27 LAB — LIPID PANEL
Cholesterol: 222 mg/dL — ABNORMAL HIGH (ref 0–200)
HDL: 40.1 mg/dL (ref >39.00)
LDL Cholesterol: 151 mg/dL — ABNORMAL HIGH (ref 0–99)
NONHDL: 182.16
TRIGLYCERIDES: 156 mg/dL — AB (ref 0.0–149.0)
Total CHOL/HDL Ratio: 6
VLDL: 31.2 mg/dL (ref 0.0–40.0)

## 2018-02-27 LAB — POC URINALSYSI DIPSTICK (AUTOMATED)
Bilirubin, UA: NEGATIVE
Blood, UA: NEGATIVE
Glucose, UA: NEGATIVE
KETONES UA: NEGATIVE
LEUKOCYTES UA: NEGATIVE
NITRITE UA: NEGATIVE
PH UA: 7 (ref 5.0–8.0)
PROTEIN UA: NEGATIVE
Spec Grav, UA: 1.02 (ref 1.010–1.025)
UROBILINOGEN UA: 0.2 U/dL

## 2018-02-27 LAB — HEMOGLOBIN A1C: HEMOGLOBIN A1C: 6.4 % (ref 4.6–6.5)

## 2018-02-28 ENCOUNTER — Telehealth: Payer: Self-pay | Admitting: Family Medicine

## 2018-02-28 ENCOUNTER — Other Ambulatory Visit: Payer: Self-pay

## 2018-02-28 LAB — HIV ANTIBODY (ROUTINE TESTING W REFLEX): HIV: NONREACTIVE

## 2018-02-28 LAB — HEPATITIS C ANTIBODY
HEP C AB: NONREACTIVE
SIGNAL TO CUT-OFF: 0.03 (ref <1.00)

## 2018-02-28 MED ORDER — METFORMIN HCL 500 MG PO TABS: 500.0000 mg | ORAL_TABLET | Freq: Two times a day (BID) | ORAL | 1 refills | Status: DC

## 2018-02-28 NOTE — Telephone Encounter (Signed)
Left message for patient to call back regarding lab results- see basket.

## 2018-02-28 NOTE — Telephone Encounter (Signed)
Copied from [place] [phone]. Topic: General - Other >> Feb 28, 2018  8:17 AM Yvette Rack wrote: Reason for CRM: pt calling about lab results

## 2018-03-01 ENCOUNTER — Telehealth: Payer: Self-pay

## 2018-03-01 NOTE — Telephone Encounter (Signed)
Called patient and let her know her lab results (HIV and Hep C) were both negative as expected. Patient verbalized understanding.

## 2018-07-03 ENCOUNTER — Encounter: Payer: Self-pay | Admitting: Family Medicine

## 2018-07-03 ENCOUNTER — Ambulatory Visit: Payer: Federal, State, Local not specified - PPO | Admitting: Family Medicine

## 2018-07-03 VITALS — BP 132/84 | HR 73 | Temp 97.9°F | Ht 63.75 in | Wt 184.6 lb

## 2018-07-03 DIAGNOSIS — R739 Hyperglycemia, unspecified: Secondary | ICD-10-CM

## 2018-07-03 DIAGNOSIS — E66811 Obesity, class 1: Secondary | ICD-10-CM | POA: Insufficient documentation

## 2018-07-03 DIAGNOSIS — E785 Hyperlipidemia, unspecified: Secondary | ICD-10-CM

## 2018-07-03 DIAGNOSIS — E669 Obesity, unspecified: Secondary | ICD-10-CM | POA: Diagnosis not present

## 2018-07-03 LAB — LDL CHOLESTEROL, DIRECT: Direct LDL: 166 mg/dL

## 2018-07-03 LAB — COMPREHENSIVE METABOLIC PANEL
ALK PHOS: 97 U/L (ref 39–117)
ALT: 15 U/L (ref 0–35)
AST: 17 U/L (ref 0–37)
Albumin: 4.3 g/dL (ref 3.5–5.2)
BILIRUBIN TOTAL: 0.5 mg/dL (ref 0.2–1.2)
BUN: 7 mg/dL (ref 6–23)
CO2: 29 mEq/L (ref 19–32)
Calcium: 9.9 mg/dL (ref 8.4–10.5)
Chloride: 103 mEq/L (ref 96–112)
Creatinine, Ser: 0.74 mg/dL (ref 0.40–1.20)
GFR: 103.67 mL/min (ref >60.00)
GLUCOSE: 99 mg/dL (ref 70–99)
POTASSIUM: 3.9 meq/L (ref 3.5–5.1)
SODIUM: 137 meq/L (ref 135–145)
TOTAL PROTEIN: 7.4 g/dL (ref 6.0–8.3)

## 2018-07-03 LAB — HEMOGLOBIN A1C: Hgb A1c MFr Bld: 6.4 % (ref 4.6–6.5)

## 2018-07-03 NOTE — Progress Notes (Signed)
Subjective:  Patty Perkins is a 57 y.o. year old very pleasant female patient who presents for/with See problem oriented charting ROS- intentional weight loss. Nausea on metformin- resolved off of it. Irritation at times under pannus- has pictures from prior infetion. Equilibrium issues- denies with head movement   Past Medical History-  Patient Active Problem List   Diagnosis Date Noted  . Hyperlipidemia 02/20/2018    Priority: Medium  . Hyperglycemia 02/20/2018    Priority: Medium  . Former smoker 02/20/2018    Priority: Low  . Allergic rhinitis     Priority: Low  . History of hiatal hernia     Priority: Low  . Obesity (BMI 30.0-34.9) 07/03/2018  . Lipoma 02/20/2018   Medications- reviewed and updated Current Outpatient Medications  Medication Sig Dispense Refill  . Ascorbic Acid (VITAMIN C) 1000 MG tablet Take 1,000 mg by mouth daily.    Marland Kitchen b complex vitamins capsule Take 1 capsule by mouth daily.    . cholecalciferol (VITAMIN D) 400 units TABS tablet Take 400 Units by mouth.    . vitamin A 10000 UNIT capsule Take 10,000 Units by mouth daily.    . Zinc 25 MG TABS Take 1 tablet by mouth daily.     No current facility-administered medications for this visit.     Objective: BP 132/84 (BP Location: Left Arm, Patient Position: Sitting, Cuff Size: Normal)   Pulse 73   Temp 97.9 F (36.6 C) (Oral)   Ht 5' 3.75" (1.619 m)   Wt 184 lb 9.6 oz (83.7 kg)   SpO2 96%   BMI 31.94 kg/m  Gen: NAD, resting comfortably CV: RRR no murmurs rubs or gallops Lungs: CTAB no crackles, wheeze, rhonchi Abdomen: soft/nontender/nondistended Skin: warm, dry Neuro: CN II-XII intact, sensation and reflexes normal throughout, 5/5 muscle strength in bilateral upper and lower extremities. Normal finger to nose. Normal rapid alternating movements. No pronator drift. Normal romberg. Normal gait.  Unable to reproduce equilibrium issues.   Assessment/Plan:  Disequilibrium S: equilibrium has been  off. Had slight achiness in right ear. Mainly over last month. Denies issues with head movement outside of walking  She does admit to poor hydration at times A/P: reassuring neuro exam- doubt CVA or mass. No clear vertigo to attribute to BPPV or similar.  She is hydrated today and cannot reproduce symptoms. We opted to monitor and push fluids (could be orthostatic hypotension)  Hyperglycemia S: Patient with insulin resistance and last a1c trending up to 6.4. We advised starting metformin 575m twice a day as well as lifestyle changes. She was getting sick to stomach and stopped after 2 weeks.  Lab Results  Component Value Date   HGBA1C 6.4 02/27/2018   HGBA1C 6.2 (H) 07/19/2013   A/P: update a1c today with labs.hopeful significant progress given weight loss  Obesity (BMI 30.0-34.9) Elevated LFT S: mild alk phos and ALT elevations- possible fatty liver. We discussed weight loss after labs last visit- she was down from peak of 240 at that time to 207 so already making progress so wanted to continue this. Her lowest had been 171 in the past with atkins. She has made INCREDIBLE progress as noted below down 23 lbs. She attributes progress to Keto diet and exercise.   She has previously said 160, but she states possibly even 150.  Wt Readings from Last 3 Encounters:  07/03/18 184 lb 9.6 oz (83.7 kg)  02/20/18 207 lb 6.4 oz (94.1 kg)  07/19/13 238 lb (108 kg)  Lab Results  Component Value Date   ALT 53 (H) 02/27/2018   AST 26 02/27/2018   ALKPHOS 180 (H) 02/27/2018   BILITOT 0.4 02/27/2018  A/P: update LFTs- hopeful for significant improvement  Hyperlipidemia S: poorly controlled on last check though 10 year risk under 7% Lab Results  Component Value Date   CHOL 222 (H) 02/27/2018   HDL 40.10 02/27/2018   LDLCALC 151 (H) 02/27/2018   TRIG 156.0 (H) 02/27/2018   CHOLHDL 6 02/27/2018   A/P: with weight loss which check direct LDL to see if any significant improvements   likely tell  her 6 month follow up after labs  Lab/Order associations: Hyperglycemia - Plan: Hemoglobin A1c, CANCELED: Hemoglobin A1c  Hyperlipidemia, unspecified hyperlipidemia type - Plan: Comprehensive metabolic panel, LDL cholesterol, direct  Obesity (BMI 30.0-34.9)  Return precautions advised.  Garret Reddish, MD

## 2018-07-03 NOTE — Assessment & Plan Note (Signed)
S: poorly controlled on last check though 10 year risk under 7% Lab Results  Component Value Date   CHOL 222 (H) 02/27/2018   HDL 40.10 02/27/2018   LDLCALC 151 (H) 02/27/2018   TRIG 156.0 (H) 02/27/2018   CHOLHDL 6 02/27/2018   A/P: with weight loss which check direct LDL to see if any significant improvements

## 2018-07-03 NOTE — Patient Instructions (Addendum)
Health Maintenance Due  Topic Date Due  . MAMMOGRAM - Hangout will be given 07/04/2010  . COLONOSCOPY - Patient will give [place] the name of location it was done at and our team will request records. 08/28/2013  . PAP SMEAR - Patient will schedule with Dr. Phineas Real (OB-GYN) 12/27/2015  . INFLUENZA VACCINE - Please schedule for fall 06/22/2018   Please stop by lab before you go  Increase water intake and let us know if not doing better from equilibrium issues

## 2018-07-03 NOTE — Assessment & Plan Note (Addendum)
Elevated LFT S: mild alk phos and ALT elevations- possible fatty liver. We discussed weight loss after labs last visit- she was down from peak of 240 at that time to 207 so already making progress so wanted to continue this. Her lowest had been 171 in the past with atkins. She has made INCREDIBLE progress as noted below down 23 lbs. She attributes progress to Keto diet and exercise.   She has previously said 160, but she states possibly even 150.  Wt Readings from Last 3 Encounters:  07/03/18 184 lb 9.6 oz (83.7 kg)  02/20/18 207 lb 6.4 oz (94.1 kg)  07/19/13 238 lb (108 kg)   Lab Results  Component Value Date   ALT 53 (H) 02/27/2018   AST 26 02/27/2018   ALKPHOS 180 (H) 02/27/2018   BILITOT 0.4 02/27/2018  A/P: update LFTs- hopeful for significant improvement  Great job on weight loss- continue efforts  Has had some pannus irritation and rashes in the past- may need to consider plastic surgery long term

## 2018-07-03 NOTE — Assessment & Plan Note (Signed)
S: Patient with insulin resistance and last a1c trending up to 6.4. We advised starting metformin 500mg  twice a day as well as lifestyle changes. She was getting sick to stomach and stopped after 2 weeks.  Lab Results  Component Value Date   HGBA1C 6.4 02/27/2018   HGBA1C 6.2 (H) 07/19/2013   A/P: update a1c today with labs.hopeful significant progress given weight loss

## 2018-07-04 ENCOUNTER — Other Ambulatory Visit: Payer: Self-pay | Admitting: Family Medicine

## 2018-07-04 DIAGNOSIS — Z1231 Encounter for screening mammogram for malignant neoplasm of breast: Secondary | ICD-10-CM

## 2018-08-07 ENCOUNTER — Ambulatory Visit
Admission: RE | Admit: 2018-08-07 | Discharge: 2018-08-07 | Disposition: A | Discharge status: Home / Self Care | Payer: Federal, State, Local not specified - PPO | Source: Ambulatory Visit | Attending: Family Medicine | Admitting: Family Medicine

## 2018-08-07 DIAGNOSIS — Z1231 Encounter for screening mammogram for malignant neoplasm of breast: Secondary | ICD-10-CM

## 2018-08-18 DIAGNOSIS — D125 Benign neoplasm of sigmoid colon: Secondary | ICD-10-CM | POA: Diagnosis not present

## 2018-08-18 DIAGNOSIS — D123 Benign neoplasm of transverse colon: Secondary | ICD-10-CM | POA: Diagnosis not present

## 2018-08-18 DIAGNOSIS — Z1211 Encounter for screening for malignant neoplasm of colon: Secondary | ICD-10-CM | POA: Diagnosis not present

## 2018-08-18 DIAGNOSIS — K5730 Diverticulosis of large intestine without perforation or abscess without bleeding: Secondary | ICD-10-CM | POA: Diagnosis not present

## 2018-08-18 LAB — HM COLONOSCOPY

## 2018-08-21 ENCOUNTER — Encounter: Payer: Self-pay | Admitting: Gynecology

## 2018-08-21 ENCOUNTER — Ambulatory Visit (INDEPENDENT_AMBULATORY_CARE_PROVIDER_SITE_OTHER): Payer: Federal, State, Local not specified - PPO | Admitting: Gynecology

## 2018-08-21 VITALS — BP 124/80 | Ht 63.0 in | Wt 194.0 lb

## 2018-08-21 DIAGNOSIS — N952 Postmenopausal atrophic vaginitis: Secondary | ICD-10-CM | POA: Diagnosis not present

## 2018-08-21 DIAGNOSIS — Z01419 Encounter for gynecological examination (general) (routine) without abnormal findings: Secondary | ICD-10-CM

## 2018-08-21 NOTE — Addendum Note (Signed)
Addended by: Nelva Nay on: 08/21/2018 09:59 AM   Modules accepted: Orders

## 2018-08-21 NOTE — Patient Instructions (Signed)
Follow-up in 1 year for annual exam, sooner if any issues. 

## 2018-08-21 NOTE — Progress Notes (Signed)
    SAMIKA VETSCH 02/12/1961 [id number]        57 y.o.  G2P0020 for annual gynecologic exam.  Has not been in for several years.  Without gynecologic complaints  Past medical history,surgical history, problem list, medications, allergies, family history and social history were all reviewed and documented as reviewed in the EPIC chart.  ROS:  Performed with pertinent positives and negatives included in the history, assessment and plan.   Additional significant findings : None   Exam: Caryn Bee assistant Vitals:   08/21/18 0807  BP: 124/80  Weight: 194 lb (88 kg)  Height: 5\' 3"  (1.6 m)   Body mass index is 34.37 kg/m.  General appearance:  Normal affect, orientation and appearance. Skin: Grossly normal HEENT: Without gross lesions.  No cervical or supraclavicular adenopathy. Thyroid normal.  Lungs:  Clear without wheezing, rales or rhonchi Cardiac: RR, without RMG Abdominal:  Soft, nontender, without masses, guarding, rebound, organomegaly or hernia Breasts:  Examined lying and sitting without masses, retractions, discharge or axillary adenopathy. Pelvic:  Ext, BUS, Vagina: With atrophic changes.  Pap smear of vaginal cuff done  Adnexa: Without masses or tenderness    Anus and perineum: Normal   Rectovaginal: Normal sphincter tone without palpated masses or tenderness.    Assessment/Plan:  57 y.o. G37P0020 female for annual gynecologic exam status post TAH/RSO, subsequent LSO..   1. Postmenopausal.  Without significant menopausal symptoms. 2. Colonoscopy 2019.  Just had done with several polyps removed and is awaiting biopsy results.  Repeat at their recommended interval. 3. Mammography 07/2018.  Continue with annual mammography next year.  Breast exam normal today. 4. Pap smear 2014.  Pap smear of vaginal cuff done today.  No history of significant abnormal Pap smears.  Options to stop screening per current screening guidelines based on hysterectomy reviewed.  Will  readdress on an annual basis. 5. DEXA never.  Will plan further into the menopause. 6. Health maintenance.  No routine lab work done as patient does this elsewhere.  Follow-up 1 year, sooner as needed   Anastasio Auerbach MD, 8:26 AM 08/21/2018

## 2018-08-22 DIAGNOSIS — D125 Benign neoplasm of sigmoid colon: Secondary | ICD-10-CM | POA: Diagnosis not present

## 2018-08-22 DIAGNOSIS — D123 Benign neoplasm of transverse colon: Secondary | ICD-10-CM | POA: Diagnosis not present

## 2018-08-22 LAB — PAP IG W/ RFLX HPV ASCU

## 2018-08-29 ENCOUNTER — Encounter: Payer: Self-pay | Admitting: Family Medicine

## 2018-08-29 DIAGNOSIS — Z86010 Personal history of colonic polyps: Secondary | ICD-10-CM | POA: Insufficient documentation

## 2018-11-06 ENCOUNTER — Ambulatory Visit: Payer: Federal, State, Local not specified - PPO | Admitting: Family Medicine

## 2018-11-21 ENCOUNTER — Ambulatory Visit: Payer: Federal, State, Local not specified - PPO | Admitting: Family Medicine

## 2019-08-21 ENCOUNTER — Encounter: Payer: Self-pay | Admitting: Gynecology

## 2020-01-04 DIAGNOSIS — Z20828 Contact with and (suspected) exposure to other viral communicable diseases: Secondary | ICD-10-CM | POA: Diagnosis not present

## 2020-03-27 ENCOUNTER — Other Ambulatory Visit: Payer: Self-pay | Admitting: Family Medicine

## 2020-03-27 DIAGNOSIS — Z1231 Encounter for screening mammogram for malignant neoplasm of breast: Secondary | ICD-10-CM

## 2020-04-11 IMAGING — MG DIGITAL SCREENING BILATERAL MAMMOGRAM WITH TOMO AND CAD
8 series · 8 of 24 positions shown · non-contrast
Comparison: None.

CLINICAL DATA: Screening.

EXAM:
DIGITAL SCREENING BILATERAL MAMMOGRAM WITH TOMO AND CAD

[L MLO synth-2D]
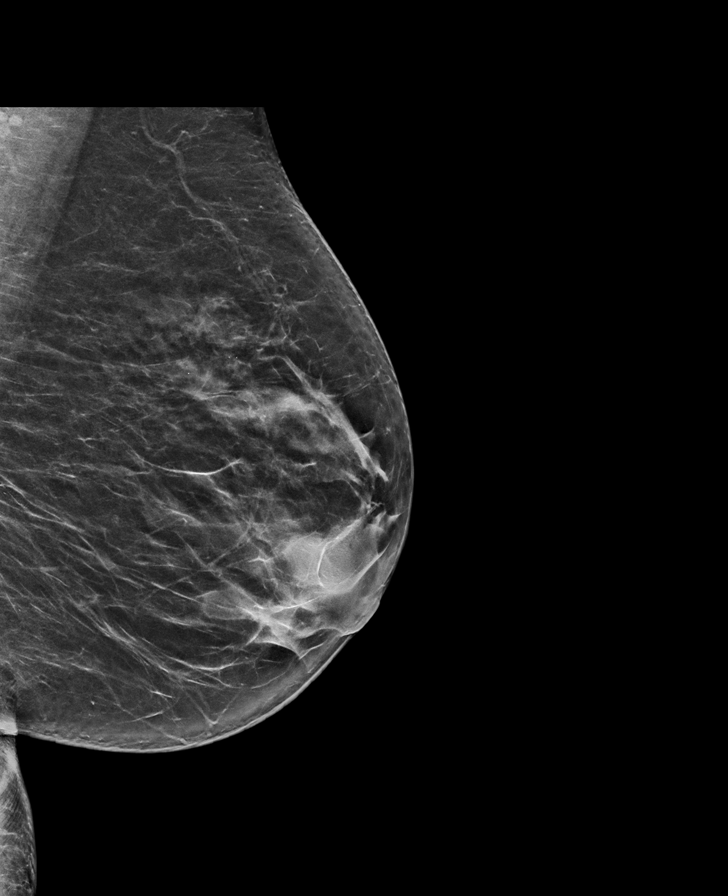

[L CC synth-2D]
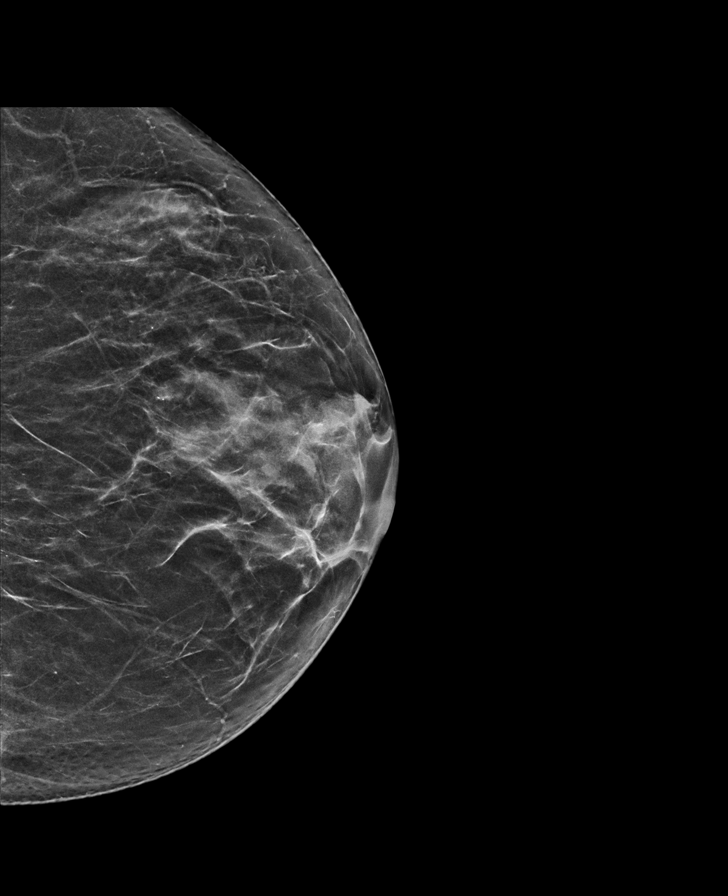

[R CC synth-2D]
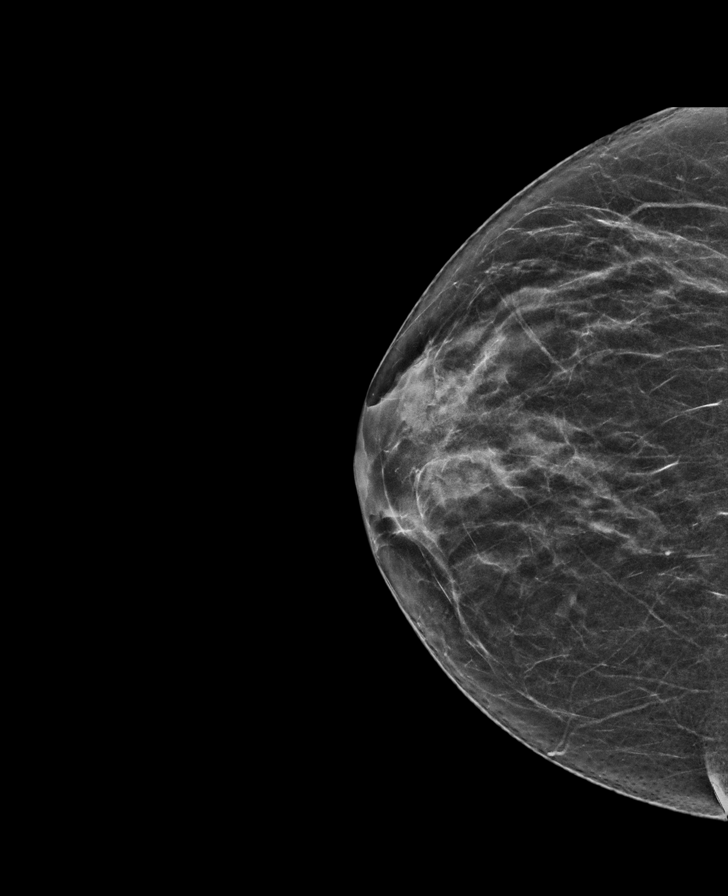

[R MLO synth-2D]
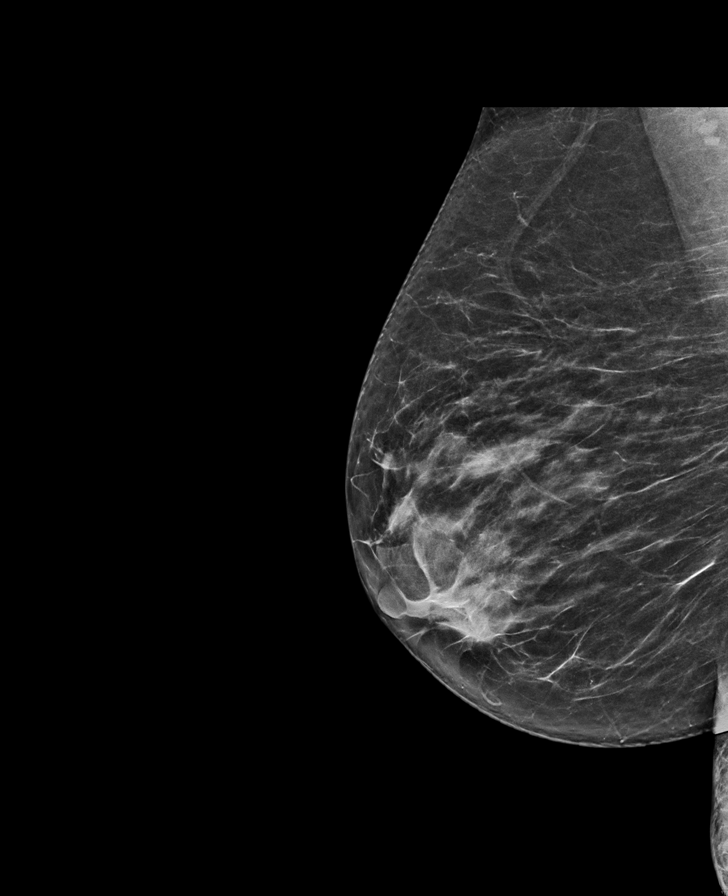

[R CC tomo · tomo slice 30/59.0]
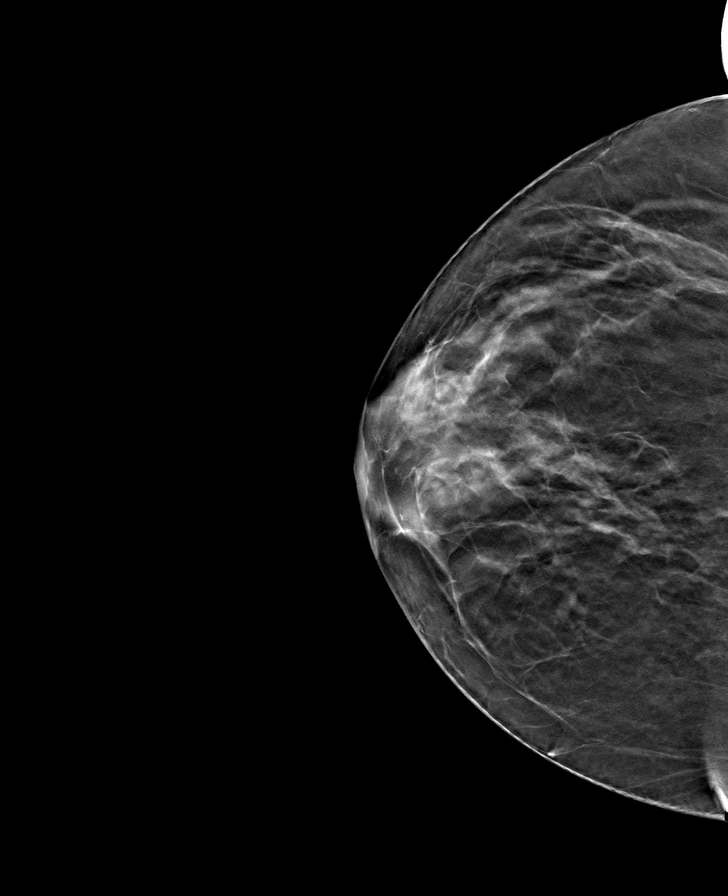

[L CC tomo · tomo slice 32/63.0]
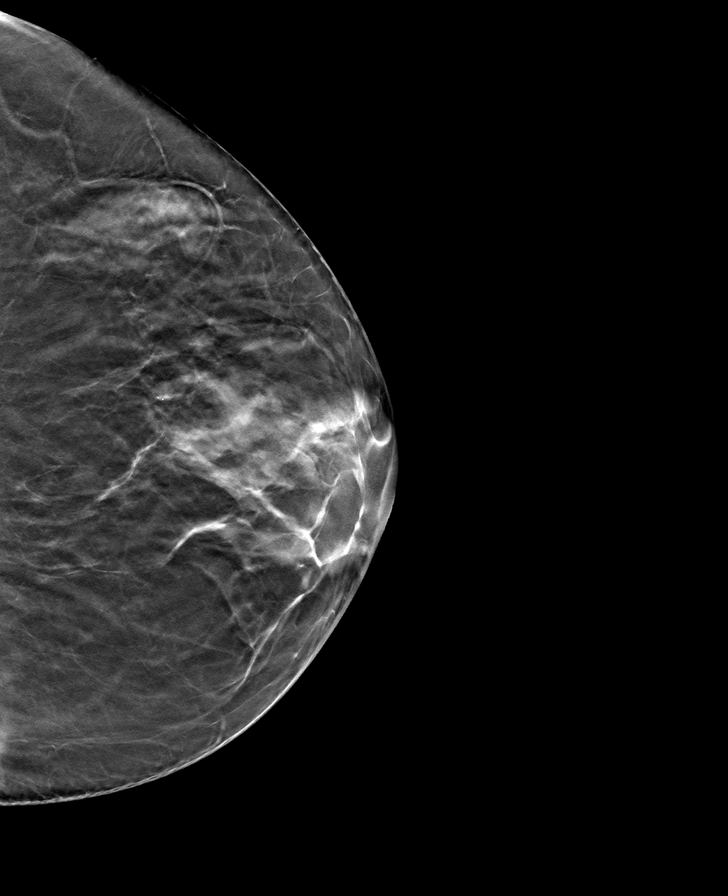

[R MLO tomo · tomo slice 33/66.0]
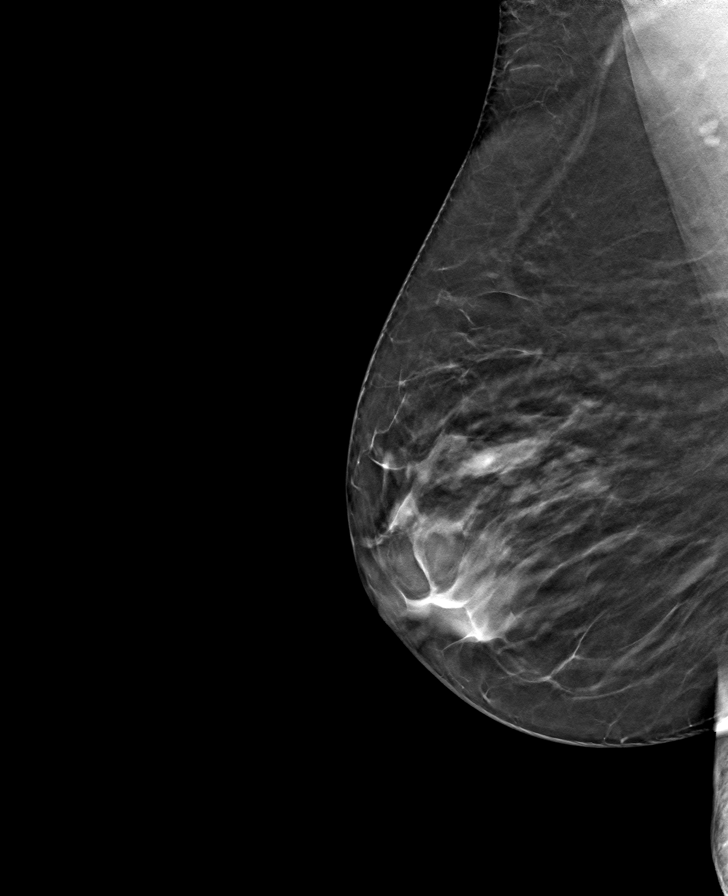

[L MLO tomo · tomo slice 36/71.0]
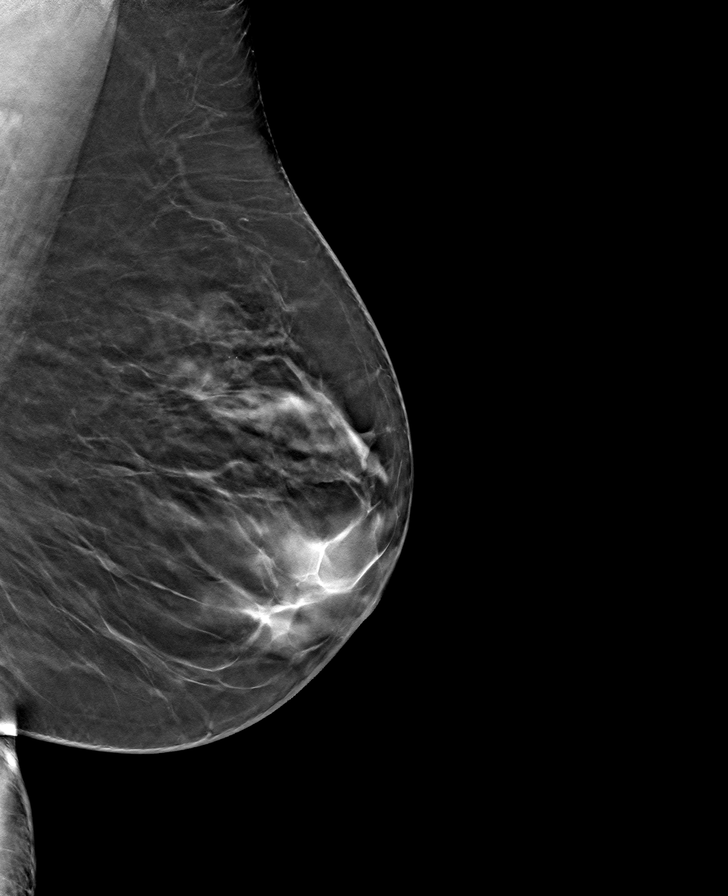

[8 of 24 positions shown; findings below may reference images not displayed]

ACR Breast Density Category c: The breast tissue is heterogeneously
dense, which may obscure small masses
FINDINGS: There are no findings suspicious for malignancy. Images were
processed with CAD.
IMPRESSION: No mammographic evidence of malignancy. A result letter of this
screening mammogram will be mailed directly to the patient.

RECOMMENDATION:
Screening mammogram in one year. (Code:EM-2-IHY)

BI-RADS CATEGORY  1: Negative.

## 2020-10-07 DIAGNOSIS — M25561 Pain in right knee: Secondary | ICD-10-CM | POA: Diagnosis not present

## 2021-11-09 ENCOUNTER — Telehealth: Payer: Self-pay | Admitting: Family Medicine

## 2021-11-09 DIAGNOSIS — R399 Unspecified symptoms and signs involving the genitourinary system: Secondary | ICD-10-CM | POA: Diagnosis not present

## 2021-11-09 DIAGNOSIS — N3001 Acute cystitis with hematuria: Secondary | ICD-10-CM | POA: Diagnosis not present

## 2021-11-09 NOTE — Telephone Encounter (Signed)
Fine with me-may take some time to get back in

## 2021-11-09 NOTE — Telephone Encounter (Signed)
Pt was previously established with Garret Reddish, last visit was 08/21/18. Pt is inquiring about reestablishing care. Please advise.

## 2021-11-12 NOTE — Telephone Encounter (Signed)
Left patient vm to call back to schedule.  °

## 2022-03-17 DIAGNOSIS — R8290 Unspecified abnormal findings in urine: Secondary | ICD-10-CM | POA: Diagnosis not present

## 2022-03-17 DIAGNOSIS — M545 Low back pain, unspecified: Secondary | ICD-10-CM | POA: Diagnosis not present

## 2022-06-01 ENCOUNTER — Encounter: Payer: Self-pay | Admitting: Family Medicine

## 2022-06-01 ENCOUNTER — Ambulatory Visit (INDEPENDENT_AMBULATORY_CARE_PROVIDER_SITE_OTHER): Payer: Federal, State, Local not specified - PPO | Admitting: Family Medicine

## 2022-06-01 VITALS — BP 100/70 | HR 91 | Temp 97.8°F | Ht 63.0 in | Wt 163.8 lb

## 2022-06-01 DIAGNOSIS — Z124 Encounter for screening for malignant neoplasm of cervix: Secondary | ICD-10-CM

## 2022-06-01 DIAGNOSIS — Z0000 Encounter for general adult medical examination without abnormal findings: Secondary | ICD-10-CM | POA: Diagnosis not present

## 2022-06-01 DIAGNOSIS — E119 Type 2 diabetes mellitus without complications: Secondary | ICD-10-CM | POA: Diagnosis not present

## 2022-06-01 DIAGNOSIS — Z1231 Encounter for screening mammogram for malignant neoplasm of breast: Secondary | ICD-10-CM

## 2022-06-01 DIAGNOSIS — E785 Hyperlipidemia, unspecified: Secondary | ICD-10-CM

## 2022-06-01 LAB — LIPID PANEL
Cholesterol: 211 mg/dL — ABNORMAL HIGH (ref 0–200)
HDL: 48.8 mg/dL (ref >39.00)
LDL Cholesterol: 141 mg/dL — ABNORMAL HIGH (ref 0–99)
NonHDL: 161.97
Total CHOL/HDL Ratio: 4
Triglycerides: 106 mg/dL (ref 0.0–149.0)
VLDL: 21.2 mg/dL (ref 0.0–40.0)

## 2022-06-01 LAB — CBC WITH DIFFERENTIAL/PLATELET
Basophils Absolute: 0 10*3/uL (ref 0.0–0.1)
Basophils Relative: 0.5 % (ref 0.0–3.0)
Eosinophils Absolute: 0.2 10*3/uL (ref 0.0–0.7)
Eosinophils Relative: 3.5 % (ref 0.0–5.0)
HCT: 41.2 % (ref 36.0–46.0)
Hemoglobin: 13.3 g/dL (ref 12.0–15.0)
Lymphocytes Relative: 33.7 % (ref 12.0–46.0)
Lymphs Abs: 2.4 10*3/uL (ref 0.7–4.0)
MCHC: 32.3 g/dL (ref 30.0–36.0)
MCV: 89.6 fl (ref 78.0–100.0)
Monocytes Absolute: 0.5 10*3/uL (ref 0.1–1.0)
Monocytes Relative: 7.4 % (ref 3.0–12.0)
Neutro Abs: 3.9 10*3/uL (ref 1.4–7.7)
Neutrophils Relative %: 54.9 % (ref 43.0–77.0)
Platelets: 326 10*3/uL (ref 150.0–400.0)
RBC: 4.6 Mil/uL (ref 3.87–5.11)
RDW: 14.4 % (ref 11.5–15.5)
WBC: 7.1 10*3/uL (ref 4.0–10.5)

## 2022-06-01 LAB — MICROALBUMIN / CREATININE URINE RATIO
Creatinine,U: 208.6 mg/dL
Microalb Creat Ratio: 0.7 mg/g (ref 0.0–30.0)
Microalb, Ur: 1.5 mg/dL (ref 0.0–1.9)

## 2022-06-01 LAB — COMPREHENSIVE METABOLIC PANEL
ALT: 17 U/L (ref 0–35)
AST: 21 U/L (ref 0–37)
Albumin: 4.1 g/dL (ref 3.5–5.2)
Alkaline Phosphatase: 88 U/L (ref 39–117)
BUN: 6 mg/dL (ref 6–23)
CO2: 26 mEq/L (ref 19–32)
Calcium: 9.3 mg/dL (ref 8.4–10.5)
Chloride: 105 mEq/L (ref 96–112)
Creatinine, Ser: 0.56 mg/dL (ref 0.40–1.20)
GFR: 98.16 mL/min (ref >60.00)
Glucose, Bld: 79 mg/dL (ref 70–99)
Potassium: 3.7 mEq/L (ref 3.5–5.1)
Sodium: 138 mEq/L (ref 135–145)
Total Bilirubin: 0.4 mg/dL (ref 0.2–1.2)
Total Protein: 7 g/dL (ref 6.0–8.3)

## 2022-06-01 LAB — HEMOGLOBIN A1C: Hgb A1c MFr Bld: 6 % (ref 4.6–6.5)

## 2022-06-01 MED ORDER — OZEMPIC (1 MG/DOSE) 4 MG/3ML ~~LOC~~ SOPN: PEN_INJECTOR | SUBCUTANEOUS | 11 refills | Status: DC

## 2022-06-01 MED ORDER — LISINOPRIL 10 MG PO TABS: 10.0000 mg | ORAL_TABLET | Freq: Every day | ORAL | 3 refills | Status: DC

## 2022-06-01 NOTE — Progress Notes (Signed)
Phone [phone]   Subjective:  Patient presents today for their annual physical. Chief complaint-noted.   See problem oriented charting- ROS- full  review of systems was completed and negative Per full ROS sheet completed by patient  The following were reviewed and entered/updated in epic: Past Medical History:  Diagnosis Date   Allergic rhinitis    pollen- neti pots   Chicken pox    Hiatal hernia    no issues per patient   Hyperlipidemia 02/20/2018   Obesity    Patient Active Problem List   Diagnosis Date Noted   Hyperlipidemia 02/20/2018    Priority: Medium    Hyperglycemia 02/20/2018    Priority: Medium    Former smoker 02/20/2018    Priority: Low   Allergic rhinitis     Priority: Low   History of hiatal hernia     Priority: Low   History of adenomatous polyp of colon 08/29/2018   Obesity (BMI 30.0-34.9) 07/03/2018   Lipoma 02/20/2018   Past Surgical History:  Procedure Laterality Date   ABDOMINAL HYSTERECTOMY  01/2010   TAH,RSO. large fibroids   DENTAL SURGERY     61 yo- wisdom teeth   EXPLORATORY LAPAROTOMY  07/22/2011   WITH EXT ADHESIOLYSIS, RESECTION OF LARGE PELVIC CYST, LSO AND PROCTOSCOPUY AT [facility]   HYSTEROSCOPY  2007   HYSTEROSCOPIC MYOMECTOMY. states 2 removals then later hystrectomy   [facility] in [place]. later left in [place]     one side. RSO W/TAH 01/2010--LSO W/ EXP LAP 07/22/11 AT [facility]   TONSILLECTOMY AND ADENOIDECTOMY      Family History  Problem Relation Age of Onset   Hypertension Mother    Hyperlipidemia Mother    Heart disease Mother        Stent age 68. defibrillator.    Ovarian cancer Sister        72 sister   Liver cancer Brother    Heart disease Brother        mid 66s- first stent   Diabetes Brother    Hypertension Brother    Hyperlipidemia Brother    Prostate cancer Father    Heart attack Maternal Grandmother        88   Stroke Maternal Grandfather    Breast cancer Neg Hx      Medications- reviewed and updated Current Outpatient Medications  Medication Sig Dispense Refill   lisinopril (ZESTRIL) 10 MG tablet Take 10 mg by mouth daily.     Multiple Vitamin (MULTIVITAMIN) tablet Take 1 tablet by mouth daily.     phentermine (ADIPEX-P) 37.5 MG tablet Take by mouth. By prior family physician in [place], 1 MG/DOSE, (OZEMPIC, 1 MG/DOSE,) 4 MG/3ML SOPN INJECT '1MG'$  SUBCUTANEOUSLY ONE TIME PER WEEK ON THE SAME DAY EACH WEEK     No current facility-administered medications for this visit.    Allergies-reviewed and updated No Known Allergies  Social History   Social History Narrative   Divorced. No children. Fransisco Beau terrier.    Mother lives with her- she is the caregiver      Work in [place]. 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    Loves her job   Control and instrumentation engineer- history/english.       Hobbies: TV nut- enjoys old movies   Objective  Objective:  BP 100/70   Pulse 91   Temp 97.8 F (  36.6 C)   Ht '5\' 3"'$  (1.6 m)   Wt 163 lb 12.8 oz (74.3 kg)   SpO2 96%   BMI 29.02 kg/m  Gen: NAD, resting comfortably HEENT: Mucous membranes are moist. Oropharynx normal Neck: no thyromegaly CV: RRR no murmurs rubs or gallops Lungs: CTAB no crackles, wheeze, rhonchi Abdomen: soft/nontender/nondistended/normal bowel sounds. No rebound or guarding.  Ext: no edema Skin: warm, dry Neuro: grossly normal, moves all extremities, PERRLA   Assessment and Plan   61 y.o. female presenting for annual physical.  Health Maintenance counseling: 1. Anticipatory guidance: Patient counseled regarding regular dental exams -q6 months, eye exams - yearly advise-d needs for diabetes,  avoiding smoking and second hand smoke , limiting alcohol to 1 beverage per day , no illicit drugs .   2. Risk factor reduction:  Advised patient of need for regular exercise and diet rich and fruits and vegetables to  reduce risk of heart attack and stroke- has membership at gym- encouraged to use.  Exercise- active with work but not outside of work.  Diet/weight management- got as high as 270 since our last visit but got back down with ozempic and phentermine- also keto diet helpful for her (she attributes much of her success to this).  Wt Readings from Last 3 Encounters:  06/01/22 163 lb 12.8 oz (74.3 kg)  08/21/18 194 lb (88 kg)  07/03/18 184 lb 9.6 oz (83.7 kg)  3. Immunizations/screenings/ancillary studies-patient declines Shingrix. Has had 3 covid shots- will send [place] dates.  flu shot- planning on in the fall  Immunization History  Administered Date(s) Administered   Tdap 02/20/2018  4. Cervical cancer screening- refer to GYN 5. Breast cancer screening-  breast exam will do with GYN and mammogram -referred to breast center 6. Colon cancer screening - 08/18/18 with 5 year repeat planned 7. Skin cancer screening- lower risk due to melanin content. advised regular sunscreen use. Denies worrisome, changing, or new skin lesions.  8. Birth control/STD check- postmenopausal- not dating 85. Osteoporosis screening at 31- wants to wait until 64 10. Smoking associated screening - former smoker- quit smoking 2007 and unde r2 pack years  Status of chronic or acute concerns   # Diabetes- new diagnosis in early 2022 with [facility] S: Medication:ozempic 1 mg  -previously prescribed metformin (she denies every taking) CBGs- does not check  A/P: hopefully controlled - update a1c today. Continue current meds for now- I refilled - we will stop phentermine- focus on weight maintenance at this point- she feels keto helpful -dm foot exam next visit  #hypertension S: medication: lisinopril 10 mg Home readings #s: does not chek A/P: good control today and asymptomatic- continue current meds  #hyperlipidemia S: Medication: not at present Lab Results  Component Value Date   CHOL 222 (H) 02/27/2018   HDL 40.10  02/27/2018   LDLCALC 151 (H) 02/27/2018   LDLDIRECT 166.0 07/03/2018   TRIG 156.0 (H) 02/27/2018   CHOLHDL 6 02/27/2018  A/P: will update lipids- she prefers to minimize dose- would be open to once weekly statin- I would likely use rosuvastatin 20 mg weekly    Recommended follow up: Return in about 4 months (around 10/02/2022) for followup or sooner if needed.Schedule b4 you leave.  Lab/Order associations: fasting   ICD-10-CM   1. Preventative health care  Z00.00 CBC with Differential/Platelet    Comprehensive metabolic panel    Lipid panel    Hemoglobin A1c    2. Hyperlipidemia, unspecified hyperlipidemia type  E78.5 CBC with  Differential/Platelet    Comprehensive metabolic panel    Lipid panel    3. Controlled type 2 diabetes mellitus without complication, without long-term current use of insulin (HCC)  E11.9 Hemoglobin A1c    Microalbumin / creatinine urine ratio    4. Screening for cervical cancer  Z12.4 Ambulatory referral to Gynecology    5. Encounter for screening mammogram for malignant neoplasm of breast  Z12.31 MM Digital Screening      Meds ordered this encounter  Medications   Semaglutide, 1 MG/DOSE, (OZEMPIC, 1 MG/DOSE,) 4 MG/3ML SOPN    Sig: INJECT '1MG'$  SUBCUTANEOUSLY ONE TIME PER WEEK ON THE SAME DAY EACH WEEK    Dispense:  3 mL    Refill:  11   lisinopril (ZESTRIL) 10 MG tablet    Sig: Take 1 tablet (10 mg total) by mouth daily.    Dispense:  90 tablet    Refill:  3    Return precautions advised.  Garret Reddish, MD

## 2022-06-01 NOTE — Patient Instructions (Addendum)
Health Maintenance Due  Topic Date Due   COVID-19 Vaccine (1)-mychart [place] dates Never done   Zoster Vaccines- Shingrix (1 of 2)- wants to hold off for now Never done   MAMMOGRAM -  [place] Schedule an appointment by calling [phone].  08/07/2020   PAP SMEAR-Modifier   We will call you within two weeks about your referral to gynecology. If you do not hear within 2 weeks, give [place] a call.   08/21/2021   Please stop by lab before you go If you have mychart- we will send your results within 3 business days of [place] receiving them.  If you do not have mychart- we will call you about results within 5 business days of [place] receiving them.  *please also note that you will see labs on mychart as soon as they post. I will later go in and write notes on them- will say "notes from Dr. Yong Channel"   Recommended follow up: Return in about 4 months (around 10/02/2022) for followup or sooner if needed.Schedule b4 you leave.

## 2022-06-04 ENCOUNTER — Other Ambulatory Visit: Payer: Self-pay

## 2022-06-04 MED ORDER — ROSUVASTATIN CALCIUM 20 MG PO TABS: 20.0000 mg | ORAL_TABLET | ORAL | 3 refills | Status: DC

## 2022-06-09 ENCOUNTER — Telehealth: Payer: Self-pay | Admitting: Family Medicine

## 2022-06-15 NOTE — Telephone Encounter (Signed)
error 

## 2022-07-12 ENCOUNTER — Ambulatory Visit: Payer: Federal, State, Local not specified - PPO

## 2022-07-14 DIAGNOSIS — R519 Headache, unspecified: Secondary | ICD-10-CM | POA: Diagnosis not present

## 2022-07-14 DIAGNOSIS — R509 Fever, unspecified: Secondary | ICD-10-CM | POA: Diagnosis not present

## 2022-07-14 DIAGNOSIS — Z20822 Contact with and (suspected) exposure to covid-19: Secondary | ICD-10-CM | POA: Diagnosis not present

## 2022-07-14 DIAGNOSIS — R5383 Other fatigue: Secondary | ICD-10-CM | POA: Diagnosis not present

## 2022-07-19 DIAGNOSIS — Z1231 Encounter for screening mammogram for malignant neoplasm of breast: Secondary | ICD-10-CM | POA: Diagnosis not present

## 2022-07-19 LAB — HM MAMMOGRAPHY

## 2022-08-08 DIAGNOSIS — M549 Dorsalgia, unspecified: Secondary | ICD-10-CM | POA: Diagnosis not present

## 2022-08-08 DIAGNOSIS — R300 Dysuria: Secondary | ICD-10-CM | POA: Diagnosis not present

## 2022-08-08 DIAGNOSIS — M545 Low back pain, unspecified: Secondary | ICD-10-CM | POA: Diagnosis not present

## 2022-08-08 DIAGNOSIS — N3090 Cystitis, unspecified without hematuria: Secondary | ICD-10-CM | POA: Diagnosis not present

## 2022-08-08 DIAGNOSIS — I10 Essential (primary) hypertension: Secondary | ICD-10-CM | POA: Diagnosis not present

## 2022-08-08 DIAGNOSIS — Z87891 Personal history of nicotine dependence: Secondary | ICD-10-CM | POA: Diagnosis not present

## 2022-08-08 DIAGNOSIS — R350 Frequency of micturition: Secondary | ICD-10-CM | POA: Diagnosis not present

## 2022-08-08 DIAGNOSIS — N3000 Acute cystitis without hematuria: Secondary | ICD-10-CM | POA: Diagnosis not present

## 2022-08-23 DIAGNOSIS — Z23 Encounter for immunization: Secondary | ICD-10-CM | POA: Diagnosis not present

## 2022-08-26 DIAGNOSIS — E559 Vitamin D deficiency, unspecified: Secondary | ICD-10-CM | POA: Diagnosis not present

## 2022-08-26 DIAGNOSIS — E7800 Pure hypercholesterolemia, unspecified: Secondary | ICD-10-CM | POA: Diagnosis not present

## 2022-08-26 DIAGNOSIS — E1165 Type 2 diabetes mellitus with hyperglycemia: Secondary | ICD-10-CM | POA: Diagnosis not present

## 2022-08-26 DIAGNOSIS — R799 Abnormal finding of blood chemistry, unspecified: Secondary | ICD-10-CM | POA: Diagnosis not present

## 2022-08-26 DIAGNOSIS — E782 Mixed hyperlipidemia: Secondary | ICD-10-CM | POA: Diagnosis not present

## 2022-08-26 DIAGNOSIS — D649 Anemia, unspecified: Secondary | ICD-10-CM | POA: Diagnosis not present

## 2022-08-26 DIAGNOSIS — E790 Hyperuricemia without signs of inflammatory arthritis and tophaceous disease: Secondary | ICD-10-CM | POA: Diagnosis not present

## 2022-10-04 ENCOUNTER — Ambulatory Visit: Payer: Federal, State, Local not specified - PPO | Admitting: Family Medicine

## 2022-11-26 ENCOUNTER — Ambulatory Visit: Payer: Federal, State, Local not specified - PPO | Admitting: Family Medicine

## 2023-03-28 DIAGNOSIS — D171 Benign lipomatous neoplasm of skin and subcutaneous tissue of trunk: Secondary | ICD-10-CM | POA: Diagnosis not present

## 2023-04-09 DIAGNOSIS — J302 Other seasonal allergic rhinitis: Secondary | ICD-10-CM | POA: Diagnosis not present

## 2023-04-14 DIAGNOSIS — I959 Hypotension, unspecified: Secondary | ICD-10-CM | POA: Diagnosis not present

## 2023-04-14 DIAGNOSIS — R400 Somnolence: Secondary | ICD-10-CM | POA: Diagnosis not present

## 2023-04-14 DIAGNOSIS — R5383 Other fatigue: Secondary | ICD-10-CM | POA: Diagnosis not present

## 2023-04-14 DIAGNOSIS — R109 Unspecified abdominal pain: Secondary | ICD-10-CM | POA: Diagnosis not present

## 2023-04-14 DIAGNOSIS — Z0389 Encounter for observation for other suspected diseases and conditions ruled out: Secondary | ICD-10-CM | POA: Diagnosis not present

## 2023-04-14 DIAGNOSIS — K769 Liver disease, unspecified: Secondary | ICD-10-CM | POA: Diagnosis not present

## 2023-08-29 ENCOUNTER — Telehealth: Payer: Self-pay | Admitting: Family Medicine

## 2023-08-29 NOTE — Telephone Encounter (Signed)
Prescription Request  08/29/2023  LOV: 06/01/22, scheduled for 02/24/24  What is the name of the medication or equipment? Semaglutide, 1 MG/DOSE, (OZEMPIC, 1 MG/DOSE,) 4 MG/3ML SOPN   Have you contacted your pharmacy to request a refill? Yes   Which pharmacy would you like this sent to?  [phone] [place] [place] - [address] [place] [address] Phone: [phone] Fax: [phone]    Patient notified that their request is being sent to the clinical staff for review and that they should receive a response within 2 business days.   Please advise at Mobile [phone] (mobile)

## 2023-08-29 NOTE — Telephone Encounter (Signed)
Patient wants to increase dosage per another Provider. Scheduled 08/31/23 with Jiles Prows

## 2023-08-31 ENCOUNTER — Other Ambulatory Visit: Payer: Self-pay

## 2023-08-31 ENCOUNTER — Ambulatory Visit: Payer: Federal, State, Local not specified - PPO | Admitting: Nurse Practitioner

## 2023-08-31 MED ORDER — OZEMPIC (1 MG/DOSE) 4 MG/3ML ~~LOC~~ SOPN: PEN_INJECTOR | SUBCUTANEOUS | 0 refills | Status: DC

## 2023-08-31 NOTE — Telephone Encounter (Signed)
FYI, can this be addressed at OV today?

## 2023-08-31 NOTE — Telephone Encounter (Signed)
Old refill sent, must keep ov for further refills.

## 2023-08-31 NOTE — Telephone Encounter (Signed)
This can not be addressed today. Appt was cancelled & rescheduled for 10/10/23. Pt has not been seen since 06/01/22 & cancelled follow up appts. She is asking if her original prescription can be called in until she sees [place]. Please advise

## 2023-10-10 ENCOUNTER — Encounter: Payer: Self-pay | Admitting: Family Medicine

## 2023-10-10 ENCOUNTER — Ambulatory Visit: Payer: Federal, State, Local not specified - PPO | Admitting: Family Medicine

## 2023-10-10 VITALS — BP 120/70 | HR 84 | Temp 98.3°F | Ht 63.0 in | Wt 180.6 lb

## 2023-10-10 DIAGNOSIS — Z7985 Long-term (current) use of injectable non-insulin antidiabetic drugs: Secondary | ICD-10-CM | POA: Diagnosis not present

## 2023-10-10 DIAGNOSIS — Z23 Encounter for immunization: Secondary | ICD-10-CM | POA: Diagnosis not present

## 2023-10-10 DIAGNOSIS — E1169 Type 2 diabetes mellitus with other specified complication: Secondary | ICD-10-CM | POA: Diagnosis not present

## 2023-10-10 DIAGNOSIS — E669 Obesity, unspecified: Secondary | ICD-10-CM

## 2023-10-10 DIAGNOSIS — Z1211 Encounter for screening for malignant neoplasm of colon: Secondary | ICD-10-CM

## 2023-10-10 DIAGNOSIS — I10 Essential (primary) hypertension: Secondary | ICD-10-CM

## 2023-10-10 DIAGNOSIS — M545 Low back pain, unspecified: Secondary | ICD-10-CM

## 2023-10-10 DIAGNOSIS — Z6831 Body mass index (BMI) 31.0-31.9, adult: Secondary | ICD-10-CM

## 2023-10-10 DIAGNOSIS — E785 Hyperlipidemia, unspecified: Secondary | ICD-10-CM

## 2023-10-10 DIAGNOSIS — G8929 Other chronic pain: Secondary | ICD-10-CM

## 2023-10-10 LAB — COMPREHENSIVE METABOLIC PANEL
ALT: 55 U/L — ABNORMAL HIGH (ref 0–35)
AST: 35 U/L (ref 0–37)
Albumin: 4.2 g/dL (ref 3.5–5.2)
Alkaline Phosphatase: 154 U/L — ABNORMAL HIGH (ref 39–117)
BUN: 10 mg/dL (ref 6–23)
CO2: 26 meq/L (ref 19–32)
Calcium: 9.2 mg/dL (ref 8.4–10.5)
Chloride: 105 meq/L (ref 96–112)
Creatinine, Ser: 0.56 mg/dL (ref 0.40–1.20)
GFR: 97.23 mL/min (ref >60.00)
Glucose, Bld: 102 mg/dL — ABNORMAL HIGH (ref 70–99)
Potassium: 3.6 meq/L (ref 3.5–5.1)
Sodium: 139 meq/L (ref 135–145)
Total Bilirubin: 0.5 mg/dL (ref 0.2–1.2)
Total Protein: 7.1 g/dL (ref 6.0–8.3)

## 2023-10-10 LAB — CBC WITH DIFFERENTIAL/PLATELET
Basophils Absolute: 0 10*3/uL (ref 0.0–0.1)
Basophils Relative: 0.5 % (ref 0.0–3.0)
Eosinophils Absolute: 0.2 10*3/uL (ref 0.0–0.7)
Eosinophils Relative: 3.4 % (ref 0.0–5.0)
HCT: 38.9 % (ref 36.0–46.0)
Hemoglobin: 12.5 g/dL (ref 12.0–15.0)
Lymphocytes Relative: 29.5 % (ref 12.0–46.0)
Lymphs Abs: 2 10*3/uL (ref 0.7–4.0)
MCHC: 32.1 g/dL (ref 30.0–36.0)
MCV: 87.6 fL (ref 78.0–100.0)
Monocytes Absolute: 0.5 10*3/uL (ref 0.1–1.0)
Monocytes Relative: 6.7 % (ref 3.0–12.0)
Neutro Abs: 4.1 10*3/uL (ref 1.4–7.7)
Neutrophils Relative %: 59.9 % (ref 43.0–77.0)
Platelets: 368 10*3/uL (ref 150.0–400.0)
RBC: 4.44 Mil/uL (ref 3.87–5.11)
RDW: 17.1 % — ABNORMAL HIGH (ref 11.5–15.5)
WBC: 6.9 10*3/uL (ref 4.0–10.5)

## 2023-10-10 LAB — HEMOGLOBIN A1C: Hgb A1c MFr Bld: 5.6 % (ref 4.6–6.5)

## 2023-10-10 LAB — LIPID PANEL
Cholesterol: 237 mg/dL — ABNORMAL HIGH (ref 0–200)
HDL: 49.2 mg/dL (ref >39.00)
LDL Cholesterol: 156 mg/dL — ABNORMAL HIGH (ref 0–99)
NonHDL: 188.13
Total CHOL/HDL Ratio: 5
Triglycerides: 160 mg/dL — ABNORMAL HIGH (ref 0.0–149.0)
VLDL: 32 mg/dL (ref 0.0–40.0)

## 2023-10-10 LAB — MICROALBUMIN / CREATININE URINE RATIO
Creatinine,U: 126.9 mg/dL
Microalb Creat Ratio: 0.6 mg/g (ref 0.0–30.0)
Microalb, Ur: <0.7 mg/dL (ref 0.0–1.9)

## 2023-10-10 MED ORDER — LISINOPRIL 10 MG PO TABS: 10.0000 mg | ORAL_TABLET | Freq: Every day | ORAL | 3 refills | Status: DC

## 2023-10-10 MED ORDER — ROSUVASTATIN CALCIUM 20 MG PO TABS: 20.0000 mg | ORAL_TABLET | ORAL | 3 refills | Status: DC

## 2023-10-10 NOTE — Patient Instructions (Addendum)
We have placed a referral for you today to sports medicine and [facility] gastroenterology Dr. Marca Ancona. In some cases you will see # listed below- you can call this if you have not heard within a week. If you do not see # listed- you should receive a mychart message or phone call within a week with the # to call directly- call that as soon as you get it. If you are having issues getting scheduled reach out to [place] again.   Get PAP and MAMMO scheduled.  Schedule next diabetic eye exam  Once we get a1c back- if looks good- stay on 1 mg- if running high- we can go up to 2 mg  Please stop by lab before you go If you have mychart- we will send your results within 3 business days of [place] receiving them.  If you do not have mychart- we will call you about results within 5 business days of [place] receiving them.  *please also note that you will see labs on mychart as soon as they post. I will later go in and write notes on them- will say "notes from Dr. Durene Cal"   Recommended follow up: Return for next already scheduled visit or sooner if needed.

## 2023-10-10 NOTE — Progress Notes (Signed)
Phone [phone] In person visit   Subjective:   Patty Perkins is a 62 y.o. year old very pleasant female patient who presents for/with See problem oriented charting Chief Complaint  Patient presents with   Hypertension    Past Medical History-  Patient Active Problem List   Diagnosis Date Noted   Type 2 diabetes mellitus with obesity (HCC) 02/20/2018    Priority: High   Essential hypertension 10/10/2023    Priority: Medium    Hyperlipidemia 02/20/2018    Priority: Medium    History of adenomatous polyp of colon 08/29/2018    Priority: Low   Former smoker 02/20/2018    Priority: Low   Lipoma 02/20/2018    Priority: Low   Allergic rhinitis     Priority: Low   History of hiatal hernia     Priority: Low    Medications- reviewed and updated Current Outpatient Medications  Medication Sig Dispense Refill   Semaglutide, 1 MG/DOSE, (OZEMPIC, 1 MG/DOSE,) 4 MG/3ML SOPN INJECT 1MG  SUBCUTANEOUSLY ONE TIME PER WEEK ON THE SAME DAY EACH WEEK 3 mL 0   lisinopril (ZESTRIL) 10 MG tablet Take 1 tablet (10 mg total) by mouth daily. 90 tablet 3   rosuvastatin (CRESTOR) 20 MG tablet Take 1 tablet (20 mg total) by mouth once a week. 13 tablet 3   No current facility-administered medications for this visit.     Objective:  BP 120/70   Pulse 84   Temp 98.3 F (36.8 C)   Ht 5\' 3"  (1.6 m)   Wt 180 lb 9.6 oz (81.9 kg)   SpO2 95%   BMI 31.99 kg/m  Gen: NAD, resting comfortably CV: RRR no murmurs rubs or gallops Lungs: CTAB no crackles, wheeze, rhonchi Ext: trace edema Skin: warm, dry  Diabetic Foot Exam - Simple   Simple Foot Form Diabetic Foot exam was performed with the following findings: Yes 10/10/2023  9:28 AM  Visual Inspection No deformities, no ulcerations, no other skin breakdown bilaterally: Yes Sensation Testing Intact to touch and monofilament testing bilaterally: Yes Pulse Check Posterior Tibialis and Dorsalis pulse intact bilaterally: Yes Comments         Assessment and Plan    # Diabetes- new diagnosis in early 2022 with [facility] S: Medication:ozempic 1 mg once a week, has been up to 2 mg before -previously prescribed metformin (she denies every taking) CBGs- no recent checks Exercise and diet- keto diet (she attributes her success in weight loss down from 270 on this) Lab Results  Component Value Date   HGBA1C 6.0 06/01/2022   HGBA1C 6.4 07/03/2018   HGBA1C 6.4 02/27/2018  A/P: hopefully stable- update a1c today. Continue current meds for now   #hypertension S: medication: lisinopril 10 mg Home readings #s: no checks at home  BP Readings from Last 3 Encounters:  10/10/23 120/70  06/01/22 100/70  08/21/18 124/80  A/P: stable- continue current medicines   #hyperlipidemia S: Medication:rosuvastatin 20 mg weekly Lab Results  Component Value Date   CHOL 211 (H) 06/01/2022   HDL 48.80 06/01/2022   LDLCALC 141 (H) 06/01/2022   LDLDIRECT 166.0 07/03/2018   TRIG 106.0 06/01/2022   CHOLHDL 4 06/01/2022  A/P: update lipids- could consider trying twice a week if not at goal- she prefers to minimize medicine- vegan type diet could also help  #Back pain and into left hip- worse with bending over- worse in past 3-4 months. Has seen chiropractor for this in past. 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 Future Appointments  Date Time Provider Department Center  02/24/2024  9:00 AM Shelva Majestic, MD [facility]    Lab/Order associations:   ICD-10-CM   1. Type 2 diabetes mellitus with obesity (HCC)  E11.69 Urine Microalbumin w/creat. ratio   E66.9 Hemoglobin A1c    Comprehensive metabolic panel    CBC with Differential/Platelet    Lipid panel    2. Hyperlipidemia, unspecified  hyperlipidemia type  E78.5     3. Essential hypertension  I10     4. Need for pneumococcal 20-valent conjugate vaccination  Z23 Pneumococcal conjugate vaccine 20-valent (Prevnar 20)    5. Screen for colon cancer  Z12.11 Ambulatory referral to Gastroenterology    6. Chronic left-sided low back pain, unspecified whether sciatica present  M54.50 Ambulatory referral to Sports Medicine   G89.29       Meds ordered this encounter  Medications   lisinopril (ZESTRIL) 10 MG tablet    Sig: Take 1 tablet (10 mg total) by mouth daily.    Dispense:  90 tablet    Refill:  3   rosuvastatin (CRESTOR) 20 MG tablet    Sig: Take 1 tablet (20 mg total) by mouth once a week.    Dispense:  13 tablet    Refill:  3    Return precautions advised.  Tana Conch, MD

## 2023-10-11 ENCOUNTER — Telehealth: Payer: Self-pay

## 2023-10-11 ENCOUNTER — Other Ambulatory Visit (HOSPITAL_COMMUNITY): Payer: Self-pay

## 2023-10-11 MED ORDER — OZEMPIC (1 MG/DOSE) 4 MG/3ML ~~LOC~~ SOPN: PEN_INJECTOR | SUBCUTANEOUS | 5 refills | Status: DC

## 2023-10-11 NOTE — Telephone Encounter (Signed)
Pharmacy Patient Advocate Encounter  Received notification from [facility] that Prior Authorization for Ozempic has been APPROVED from 10/11/23 to 10/10/24

## 2023-10-11 NOTE — Telephone Encounter (Signed)
Pt needing PA on Ozempic 1mg .

## 2023-10-11 NOTE — Telephone Encounter (Signed)
Pharmacy Patient Advocate Encounter   Received notification from Pt Calls Messages that prior authorization for Ozempic is required/requested.   Insurance verification completed.   The patient is insured through [facility] .   Per test claim: PA required; PA submitted to above mentioned insurance via CoverMyMeds Key/confirmation #/EOC [id number] Status is pending

## 2023-10-11 NOTE — Addendum Note (Signed)
Addended by: Gwenette Greet on: 10/11/2023 03:17 PM   Modules accepted: Orders

## 2023-10-11 NOTE — Telephone Encounter (Signed)
Refill sent to pharmacy.   

## 2023-10-11 NOTE — Addendum Note (Signed)
Addended by: Gwenette Greet on: 10/11/2023 03:16 PM   Modules accepted: Orders

## 2023-10-18 ENCOUNTER — Telehealth: Payer: Self-pay | Admitting: Family Medicine

## 2023-10-18 MED ORDER — OZEMPIC (1 MG/DOSE) 4 MG/3ML ~~LOC~~ SOPN: PEN_INJECTOR | SUBCUTANEOUS | 5 refills | Status: DC

## 2023-10-18 NOTE — Telephone Encounter (Signed)
Rx re sent to local pharmacy.

## 2023-10-18 NOTE — Telephone Encounter (Signed)
Called and spoke with pt and results reviewed.

## 2023-10-18 NOTE — Telephone Encounter (Signed)
Pt would like a call back with lab results

## 2023-10-18 NOTE — Telephone Encounter (Signed)
Patient called requesting that Semaglutide, 1 MG/DOSE, (OZEMPIC, 1 MG/DOSE,) 4 MG/3ML SOPN  be [facility] [place] [place] [facility] of [facility] order. States medication was sitting on porch for 3 days and they are also charging her heavily for it through this type of delivery. States she will be sending back the rx that was sent on 11/19.

## 2023-10-28 NOTE — Progress Notes (Unsigned)
    Aleen Sells D.[facility] [address] [place] [address] Phone: [phone]   Assessment and Plan:     There are no diagnoses linked to this encounter.  ***   Pertinent previous records reviewed include ***    Follow Up: ***     Subjective:   I, Azariah Latendresse, am serving as a Neurosurgeon for Doctor Richardean Sale  Chief Complaint: low back pain   HPI:   10/31/2023 Patient is a 62 year old female with low back pain. Patient states  Relevant Historical Information: ***  Additional pertinent review of systems negative.   Current Outpatient Medications:    lisinopril (ZESTRIL) 10 MG tablet, Take 1 tablet (10 mg total) by mouth daily., Disp: 90 tablet, Rfl: 3   rosuvastatin (CRESTOR) 20 MG tablet, Take 1 tablet (20 mg total) by mouth once a week., Disp: 13 tablet, Rfl: 3   Semaglutide, 1 MG/DOSE, (OZEMPIC, 1 MG/DOSE,) 4 MG/3ML SOPN, INJECT 1MG  SUBCUTANEOUSLY ONE TIME PER WEEK ON THE SAME DAY EACH WEEK, Disp: 3 mL, Rfl: 5   Objective:     There were no vitals filed for this visit.    There is no height or weight on file to calculate BMI.    Physical Exam:    ***   Electronically signed by:  Aleen Sells D.[facility] 7:33 AM 10/28/23

## 2023-10-31 ENCOUNTER — Ambulatory Visit: Payer: Federal, State, Local not specified - PPO | Admitting: Sports Medicine

## 2023-10-31 ENCOUNTER — Ambulatory Visit (INDEPENDENT_AMBULATORY_CARE_PROVIDER_SITE_OTHER): Payer: Federal, State, Local not specified - PPO

## 2023-10-31 VITALS — BP 138/88 | HR 87 | Ht 63.0 in | Wt 185.0 lb

## 2023-10-31 DIAGNOSIS — M4316 Spondylolisthesis, lumbar region: Secondary | ICD-10-CM

## 2023-10-31 DIAGNOSIS — M5442 Lumbago with sciatica, left side: Secondary | ICD-10-CM

## 2023-10-31 DIAGNOSIS — M533 Sacrococcygeal disorders, not elsewhere classified: Secondary | ICD-10-CM | POA: Diagnosis not present

## 2023-10-31 DIAGNOSIS — G8929 Other chronic pain: Secondary | ICD-10-CM

## 2023-10-31 DIAGNOSIS — M545 Low back pain, unspecified: Secondary | ICD-10-CM

## 2023-10-31 DIAGNOSIS — M47816 Spondylosis without myelopathy or radiculopathy, lumbar region: Secondary | ICD-10-CM | POA: Diagnosis not present

## 2023-10-31 MED ORDER — MELOXICAM 15 MG PO TABS: 15.0000 mg | ORAL_TABLET | Freq: Every day | ORAL | 0 refills | Status: AC

## 2023-10-31 NOTE — Patient Instructions (Signed)
-   Start meloxicam 15 mg daily x2 weeks.  If still having pain after 2 weeks, complete 3rd-week of NSAID. May use remaining NSAID as needed once daily for pain control.  Do not to use additional over-the-counter NSAIDs (ibuprofen, naproxen, Advil, Aleve) while taking prescription NSAIDs.  May use Tylenol [phone] mg 2 to 3 times a day for breakthrough pain. Low back HEP  PT referral  4 week follow up

## 2023-11-21 NOTE — Progress Notes (Deleted)
    Ben Jackson D.[facility]MENTE[facility] [address] [place] [address] Phone: [phone]   Assessment and Plan:     There are no diagnoses linked to this encounter.  ***   Pertinent previous records reviewed include ***    Follow Up: ***     Subjective:   I, Dayani Winbush, am serving as a neurosurgeon for Doctor Morene Mace   Chief Complaint: low back pain    HPI:    10/31/2023 Patient is a 62 year old female with low back pain. Patient states that she has pain for sometime the pain has increase over the year. Pain left side low back that goes down her legs. No numbness or tingling. Alee for the pain when it is really flares. Hx of lumbar fusion genetic has been using and ice and stretching. States this pain is a dull ache. Pain felt the most when flexion. Side to side rolling is very painful  11/28/2023 Patient states   Relevant Historical Information: DM type II, hypertension  Additional pertinent review of systems negative.   Current Outpatient Medications:    lisinopril  (ZESTRIL ) 10 MG tablet, Take 1 tablet (10 mg total) by mouth daily., Disp: 90 tablet, Rfl: 3   meloxicam  (MOBIC ) 15 MG tablet, Take 1 tablet (15 mg total) by mouth daily., Disp: 30 tablet, Rfl: 0   rosuvastatin  (CRESTOR ) 20 MG tablet, Take 1 tablet (20 mg total) by mouth once a week., Disp: 13 tablet, Rfl: 3   Semaglutide , 1 MG/DOSE, (OZEMPIC , 1 MG/DOSE,) 4 MG/3ML SOPN, INJECT 1MG  SUBCUTANEOUSLY ONE TIME PER WEEK ON THE SAME DAY EACH WEEK, Disp: 3 mL, Rfl: 5   Objective:     There were no vitals filed for this visit.    There is no height or weight on file to calculate BMI.    Physical Exam:    ***   Electronically signed by:  Odis Mace D.[facility] 8:13 AM 11/21/23

## 2023-11-21 NOTE — Therapy (Signed)
 OUTPATIENT PHYSICAL THERAPY THORACOLUMBAR EVALUATION   Patient Name: Patty Perkins MRN: [id number] DOB:[birth year], 62 y.o., female Today's Date: 11/22/2023  END OF SESSION:  PT End of Session - 11/22/23 1451     Visit Number 1    Date for PT Re-Evaluation 12/20/23    Authorization Type BCBS    PT Start Time 1451    PT Stop Time 1527    PT Time Calculation (min) 36 min    Activity Tolerance Patient tolerated treatment well    Behavior During Therapy WFL for tasks assessed/performed             Past Medical History:  Diagnosis Date   Allergic rhinitis    pollen- neti pots   Chicken pox    Hiatal hernia    no issues per patient   Hyperlipidemia 02/20/2018   Obesity    Past Surgical History:  Procedure Laterality Date   ABDOMINAL HYSTERECTOMY  01/2010   TAH,RSO. large fibroids   DENTAL SURGERY     62 yo- wisdom teeth   EXPLORATORY LAPAROTOMY  07/22/2011   WITH EXT ADHESIOLYSIS, RESECTION OF LARGE PELVIC CYST, LSO AND PROCTOSCOPUY AT [facility]   HYSTEROSCOPY  2007   HYSTEROSCOPIC MYOMECTOMY. states 2 removals then later hystrectomy   MENISCUS REPAIR     R in [place] . later left in [place]   MYOMECTOMY     OOPHORECTOMY     one side. RSO W/TAH 01/2010--LSO W/ EXP LAP 07/22/11 AT [facility]   TONSILLECTOMY AND ADENOIDECTOMY     Patient Active Problem List   Diagnosis Date Noted   Essential hypertension 10/10/2023   History of adenomatous polyp of colon 08/29/2018   Hyperlipidemia 02/20/2018   Type 2 diabetes mellitus with obesity (HCC) 02/20/2018   Former smoker 02/20/2018   Lipoma 02/20/2018   Allergic rhinitis    History of hiatal hernia     PCP: Katrinka Garnette KIDD, MD   REFERRING PROVIDER: Leonce Katz, DO   REFERRING DIAG: [id number] (ICD-10-CM) - Chronic bilateral low back pain with left-sided sciatica   Rationale for Evaluation and Treatment: Rehabilitation  THERAPY DIAG:  Chronic left-sided low back pain without sciatica  Other symptoms and signs  involving the musculoskeletal system  ONSET DATE: over a year  SUBJECTIVE:                                                                                                                                                                                           SUBJECTIVE STATEMENT:  Left SIJ pain x several months; over a year. Feel it when I lean forward to pick up something in sitting. Prior to  Meloxicam  she had pain rolling over in bed and going up stairs.   PERTINENT HISTORY:  LBP - xrays show fused L5/S1  PAIN:  Are you having pain? Yes: NPRS scale: 0 up to 8-9/10 Pain location: Left SIJ Pain description: crushing dull pain Aggravating factors: bending Relieving factors: getting out of painful position  PRECAUTIONS: None  RED FLAGS: None   WEIGHT BEARING RESTRICTIONS: No  FALLS:  Has patient fallen in last 6 months? No  LIVING ENVIRONMENT: Lives with: lives with their family Lives in: House/apartment Stairs: Yes: Internal: 5 steps; on right going up Has following equipment at home: None  OCCUPATION: [employer] at [place] on [place]   PLOF: Independent  PATIENT GOALS: no pain  NEXT MD VISIT: 11/29/23  OBJECTIVE:  Note: Objective measures were completed at Evaluation unless otherwise noted.  DIAGNOSTIC FINDINGS:  XR There are five non-rib bearing lumbar-type vertebral bodies with a rudimentary disc space at S1-2. Grade 1 anterolisthesis of L4-5. This is favored due to facet arthropathy. There is no evidence for acute fracture or subluxation. Mild intervertebral disc space height loss of L5-S1. Lower lumbar facet arthropathy. Atherosclerotic calcifications.  PATIENT SURVEYS:  Modified Oswestry 6 / 50 = 12.0 %   COGNITION: Overall cognitive status: Within functional limits for tasks assessed     SENSATION: WFL  MUSCLE LENGTH:   POSTURE:  L post innominate  PALPATION: Tender at L PSIS region Elevated left sacrum   LUMBAR ROM:   AROM eval  Flexion WNL   Extension WNL  Right lateral flexion WNL  Left lateral flexion 75%* in L SIJ  Right rotation WNL  Left rotation 75%   (Blank rows = not tested)  LOWER EXTREMITY ROM:   WNL    Right eval Left eval  Hip flexion    Hip extension    Hip abduction    Hip adduction    Hip internal rotation    Hip external rotation    Knee flexion    Knee extension    Ankle dorsiflexion    Ankle plantarflexion    Ankle inversion    Ankle eversion     (Blank rows = not tested)  LOWER EXTREMITY MMT:  grossly 4+/5 to 5/5 in BLE (ankle not tested)  MMT Right eval Left eval  Hip flexion    Hip extension    Hip abduction    Hip adduction    Hip internal rotation    Hip external rotation    Knee flexion    Knee extension    Ankle dorsiflexion    Ankle plantarflexion    Ankle inversion    Ankle eversion     (Blank rows = not tested)  LUMBAR SPECIAL TESTS:  Long sit test: short on left   TREATMENT DATE:  11/22/23 See pt ed and HEP  Self MET with dowel to correct L post innominate   PATIENT EDUCATION:  Education details: PT eval findings, anticipated POC, initial HEP, and discussion of MOI, mechanics of self correction and expected soreness afterwards; explanation of when to use HEP.   Person educated: Patient Education method: Explanation, Demonstration, Verbal cues, and Handouts Education comprehension: verbalized understanding and returned demonstration  HOME EXERCISE PROGRAM: Access Code: [email or web address] URL: [email or web address] Date: 11/22/2023 Prepared by: Mliss  Exercises - 90/90 SI Joint Self-Correction with Dowel  - 1 x daily - 7 x weekly - 1 sets - 3 reps - 5 sec hold  ASSESSMENT:  CLINICAL IMPRESSION: Patient is a 62 y.o. female who was seen today for physical therapy evaluation and treatment for left low back pain affecting her  ability to bend forward while seated, rollover in bed and climb stairs without pain. She demonstrates a left posteriorly rotated innominate which was corrected in the clinic with self MET. She was able to bend forward in sitting without pain immediately following. She has mild flexibility and strength deficits in B LE. She will benefit from skilled PT to address these deficits.  Plan is for patient to use HEP for one week and re-assess her pain levels. If pain does not return, pt will be put on hold for 30 days and then discharged. If pain returns she will make f/u appointments.   OBJECTIVE IMPAIRMENTS: decreased activity tolerance, decreased ROM, decreased strength, impaired flexibility, postural dysfunction, and pain.   ACTIVITY LIMITATIONS: bending, stairs, and bed mobility  PARTICIPATION LIMITATIONS: occupation  PERSONAL FACTORS: Time since onset of injury/illness/exacerbation are also affecting patient's functional outcome.   REHAB POTENTIAL: Excellent  CLINICAL DECISION MAKING: Stable/uncomplicated  EVALUATION COMPLEXITY: Low   GOALS: Goals reviewed with patient? Yes  SHORT TERM GOALS: Target date: 12/06/2023   Patient will be independent with initial HEP.  Baseline:  Goal status: INITIAL  2.  Patient will report decreased back pain by 50%  Baseline:  Goal status: INITIAL   LONG TERM GOALS: Target date: 12/20/2023   Patient will be independent with advanced/ongoing HEP to improve outcomes and carryover.  Baseline:  Goal status: INITIAL  2.  Patient will report no low back pain with ADLS to improve QOL.  Baseline:  Goal status: INITIAL  3.  Patient will report 0 on Modified Oswestry to demonstrate improved functional ability.  Baseline: 6/50 Goal status: INITIAL    PLAN:  PT FREQUENCY: 1-2x/week  PT DURATION: 4 weeks  PLANNED INTERVENTIONS: 97164- PT Re-evaluation, 97110-Therapeutic exercises, 97530- Therapeutic activity, 97112- Neuromuscular re-education,  97535- Self Care, 02859- Manual therapy, 97014- Electrical stimulation (unattended), Patient/Family education, Taping, Dry Needling, Joint mobilization, Spinal mobilization, Cryotherapy, and Moist heat.  PLAN FOR NEXT SESSION: Assess pelvic landmarks (long leg test), hip strength and flexibility (hip flexors, quads, HS)   Mliss Cummins, PT  11/22/2023, 3:54 PM

## 2023-11-22 ENCOUNTER — Ambulatory Visit: Payer: Federal, State, Local not specified - PPO | Attending: Sports Medicine | Admitting: Physical Therapy

## 2023-11-22 ENCOUNTER — Encounter: Payer: Self-pay | Admitting: Physical Therapy

## 2023-11-22 ENCOUNTER — Other Ambulatory Visit: Payer: Self-pay

## 2023-11-22 DIAGNOSIS — R29898 Other symptoms and signs involving the musculoskeletal system: Secondary | ICD-10-CM | POA: Insufficient documentation

## 2023-11-22 DIAGNOSIS — G8929 Other chronic pain: Secondary | ICD-10-CM | POA: Diagnosis not present

## 2023-11-22 DIAGNOSIS — M545 Low back pain, unspecified: Secondary | ICD-10-CM | POA: Diagnosis not present

## 2023-11-22 DIAGNOSIS — M5442 Lumbago with sciatica, left side: Secondary | ICD-10-CM | POA: Diagnosis not present

## 2023-11-27 ENCOUNTER — Other Ambulatory Visit: Payer: Self-pay | Admitting: Sports Medicine

## 2023-11-28 ENCOUNTER — Ambulatory Visit: Payer: Federal, State, Local not specified - PPO | Admitting: Sports Medicine

## 2023-12-08 DIAGNOSIS — Z01419 Encounter for gynecological examination (general) (routine) without abnormal findings: Secondary | ICD-10-CM | POA: Diagnosis not present

## 2023-12-22 LAB — HM DIABETES EYE EXAM

## 2024-02-24 ENCOUNTER — Encounter: Payer: Federal, State, Local not specified - PPO | Admitting: Family Medicine

## 2024-03-12 ENCOUNTER — Other Ambulatory Visit: Payer: Self-pay | Admitting: Family Medicine

## 2024-03-12 NOTE — Telephone Encounter (Signed)
 Copied from CRM [id number]. Topic: Clinical - Medication Refill >> Mar 12, 2024 12:38 PM Rosamond Comes wrote: Patient calling in requesting refills wanting to  [facility]  [address] [place] [address]  [phone]  Most Recent Primary Care Visit:  Provider: Almira Jaeger  Department: [facility]  Visit Type: OFFICE VISIT  Date: 10/10/2023  Medication:  Semaglutide , 1 MG/DOSE, (OZEMPIC , 1 MG/DOSE,) 4 MG/3ML SOPN rosuvastatin  (CRESTOR ) 20 MG tablet   Has the patient contacted their pharmacy? No  send to new pharmacy  Is this the correct pharmacy for this prescription? Yes If no, delete pharmacy and type the correct one.  This is the patient's preferred pharmacy:   [facility]  [address] [place] [address]  [phone]    Has the prescription been filled recently? Yes  Is the patient out of the medication? Yes  Has the patient been seen for an appointment in the last year OR does the patient have an upcoming appointment? Yes  Can we respond through MyChart? No  Agent: Please be advised that Rx refills may take up to 3 business days. We ask that you follow-up with your pharmacy.  Patient is leaving town late [place] and would like this called in today.

## 2024-03-13 MED ORDER — ROSUVASTATIN CALCIUM 20 MG PO TABS: 20.0000 mg | ORAL_TABLET | ORAL | 3 refills | Status: DC

## 2024-03-13 MED ORDER — OZEMPIC (1 MG/DOSE) 4 MG/3ML ~~LOC~~ SOPN: PEN_INJECTOR | SUBCUTANEOUS | 5 refills | Status: DC

## 2024-04-01 DIAGNOSIS — Z20822 Contact with and (suspected) exposure to covid-19: Secondary | ICD-10-CM | POA: Diagnosis not present

## 2024-04-01 DIAGNOSIS — J069 Acute upper respiratory infection, unspecified: Secondary | ICD-10-CM | POA: Diagnosis not present

## 2024-04-01 DIAGNOSIS — R509 Fever, unspecified: Secondary | ICD-10-CM | POA: Diagnosis not present

## 2024-04-01 DIAGNOSIS — J029 Acute pharyngitis, unspecified: Secondary | ICD-10-CM | POA: Diagnosis not present

## 2024-04-08 ENCOUNTER — Other Ambulatory Visit: Payer: Self-pay

## 2024-04-08 ENCOUNTER — Ambulatory Visit (INDEPENDENT_AMBULATORY_CARE_PROVIDER_SITE_OTHER)

## 2024-04-08 ENCOUNTER — Ambulatory Visit
Admission: EM | Admit: 2024-04-08 | Discharge: 2024-04-08 | Disposition: A | Discharge status: Home / Self Care | Attending: Family Medicine | Admitting: Family Medicine

## 2024-04-08 DIAGNOSIS — R051 Acute cough: Secondary | ICD-10-CM | POA: Diagnosis not present

## 2024-04-08 DIAGNOSIS — R059 Cough, unspecified: Secondary | ICD-10-CM | POA: Diagnosis not present

## 2024-04-08 DIAGNOSIS — R918 Other nonspecific abnormal finding of lung field: Secondary | ICD-10-CM | POA: Diagnosis not present

## 2024-04-08 DIAGNOSIS — H66001 Acute suppurative otitis media without spontaneous rupture of ear drum, right ear: Secondary | ICD-10-CM | POA: Diagnosis not present

## 2024-04-08 DIAGNOSIS — J329 Chronic sinusitis, unspecified: Secondary | ICD-10-CM | POA: Diagnosis not present

## 2024-04-08 DIAGNOSIS — J40 Bronchitis, not specified as acute or chronic: Secondary | ICD-10-CM | POA: Diagnosis not present

## 2024-04-08 MED ORDER — AMOXICILLIN-POT CLAVULANATE 875-125 MG PO TABS: 1.0000 | ORAL_TABLET | Freq: Two times a day (BID) | ORAL | 0 refills | Status: AC

## 2024-04-08 MED ORDER — PROMETHAZINE-DM 6.25-15 MG/5ML PO SYRP
5.0000 mL | ORAL_SOLUTION | Freq: Three times a day (TID) | ORAL | 0 refills | Status: DC | PRN
Start: 2024-04-08 — End: 2024-08-21

## 2024-04-08 NOTE — Discharge Instructions (Addendum)
 Start Augmentin twice daily for 10 days.  You may take Promethazine DM as needed for your cough.  Please this medication will make you drowsy.  Do not drink alcohol or drive on this medication.  Lots of rest and fluids.  Please follow-up with your PCP if your symptoms do not improve.  Please go to the ER if you develop any worsening symptoms.  Hope you feel better soon!

## 2024-04-08 NOTE — ED Provider Notes (Signed)
 [facility]    CSN: [id number] Arrival date & time: 04/08/24  1334      History   Chief Complaint No chief complaint on file.   HPI Patty Perkins is a 63 y.o. female  presents for evaluation of URI symptoms for 1-1.5 weeks. Patient reports associated symptoms of cough, congestion, right ear pain, sinus pressure/pain with headache and chills, body aches.  Did have fever on the first few days of symptoms but this is since resolved.  Denies N/V/D, shortness of breath. Patient does not have a hx of asthma. Patient is not an active smoker.   Reports sick contacts via work.  She was seen in urgent care on 5/11 same symptoms.  She had a negative COVID, flu, strep testing.  She was given Tessalon and started on Tamiflu due to her symptoms.  Reports no improvement with that treatment.  Pt has taken cough medicine OTC for symptoms. Pt has no other concerns at this time.   HPI  Past Medical History:  Diagnosis Date   Allergic rhinitis    pollen- neti pots   Chicken pox    Hiatal hernia    no issues per patient   Hyperlipidemia 02/20/2018   Obesity     Patient Active Problem List   Diagnosis Date Noted   Essential hypertension 10/10/2023   History of adenomatous polyp of colon 08/29/2018   Hyperlipidemia 02/20/2018   Type 2 diabetes mellitus with obesity (HCC) 02/20/2018   Former smoker 02/20/2018   Lipoma 02/20/2018   Allergic rhinitis    History of hiatal hernia     Past Surgical History:  Procedure Laterality Date   ABDOMINAL HYSTERECTOMY  01/2010   TAH,RSO. large fibroids   DENTAL SURGERY     63 yo- wisdom teeth   EXPLORATORY LAPAROTOMY  07/22/2011   WITH EXT ADHESIOLYSIS, RESECTION OF LARGE PELVIC CYST, LSO AND PROCTOSCOPUY AT [facility]   HYSTEROSCOPY  2007   HYSTEROSCOPIC MYOMECTOMY. states 2 removals then later hystrectomy   MENISCUS REPAIR     R in [place] . later left in [place]   MYOMECTOMY     OOPHORECTOMY     one side. RSO W/TAH 01/2010--LSO W/ EXP LAP  07/22/11 AT [facility]   TONSILLECTOMY AND ADENOIDECTOMY      OB History     Gravida  2   Para  0   Term      Preterm      AB  2   Living  0      SAB      IAB      Ectopic      Multiple      Live Births               Home Medications    Prior to Admission medications   Medication Sig Start Date End Date Taking? Authorizing Provider  amoxicillin-clavulanate (AUGMENTIN) 875-125 MG tablet Take 1 tablet by mouth every 12 (twelve) hours for 10 days. 04/08/24 04/18/24 Yes Tabor Denham, Jodi R, NP  promethazine-dextromethorphan (PROMETHAZINE-DM) 6.25-15 MG/5ML syrup Take 5 mLs by mouth 3 (three) times daily as needed for cough. 04/08/24  Yes Bethany Cumming, Jodi R, NP  lisinopril  (ZESTRIL ) 10 MG tablet Take 1 tablet (10 mg total) by mouth daily. 10/10/23   Almira Jaeger, MD  meloxicam  (MOBIC ) 15 MG tablet Take 1 tablet (15 mg total) by mouth daily. 10/31/23   Ulysees Gander, DO  rosuvastatin  (CRESTOR ) 20 MG tablet Take 1 tablet (20 mg total) by  mouth once a week. 03/13/24   Almira Jaeger, MD  Semaglutide , 1 MG/DOSE, (OZEMPIC , 1 MG/DOSE,) 4 MG/3ML SOPN INJECT 1MG  SUBCUTANEOUSLY ONE TIME PER WEEK ON THE SAME DAY EACH WEEK 03/13/24   Almira Jaeger, MD    Family History Family History  Problem Relation Age of Onset   Hypertension Mother    Hyperlipidemia Mother    Heart disease Mother        Stent age 56. defibrillator.    Heart failure Mother    Diabetes Mother    Valvular heart disease Mother        valve replacement   Prostate cancer Father    Ovarian cancer Sister        Half sister   Liver cancer Brother    Heart disease Brother        mid 43s- first stent   Diabetes Brother    Hypertension Brother    Hyperlipidemia Brother    Heart attack Maternal Grandmother        49   Stroke Maternal Grandfather    Breast cancer Neg Hx     Social History Social History   Tobacco Use   Smoking status: Former    Current packs/day: 0.00    Average packs/day: 0.3 packs/day  for 5.0 years (1.5 ttl pk-yrs)    Types: Cigarettes    Start date: 12/30/2000    Quit date: 12/30/2005    Years since quitting: 18.2   Smokeless tobacco: Never  Vaping Use   Vaping status: Never Used  Substance Use Topics   Alcohol use: No   Drug use: No     Allergies   Patient has no known allergies.   Review of Systems Review of Systems  HENT:  Positive for congestion, ear pain, sinus pressure and sinus pain.   Respiratory:  Positive for cough.   Neurological:  Positive for headaches.     Physical Exam Triage Vital Signs ED Triage Vitals  Encounter Vitals Group     BP 04/08/24 1419 (!) 155/91     Systolic BP Percentile --      Diastolic BP Percentile --      Pulse Rate 04/08/24 1419 91     Resp 04/08/24 1419 17     Temp 04/08/24 1419 98.5 F (36.9 C)     Temp Source 04/08/24 1419 Oral     SpO2 04/08/24 1419 93 %     Weight --      Height --      Head Circumference --      Peak Flow --      Pain Score 04/08/24 1417 8     Pain Loc --      Pain Education --      Exclude from Growth Chart --    No data found.  Updated Vital Signs BP (!) 155/91   Pulse 91   Temp 98.5 F (36.9 C) (Oral)   Resp 17   SpO2 93%   Visual Acuity Right Eye Distance:   Left Eye Distance:   Bilateral Distance:    Right Eye Near:   Left Eye Near:    Bilateral Near:     Physical Exam Vitals and nursing note reviewed.  Constitutional:      General: She is not in acute distress.    Appearance: She is well-developed. She is not ill-appearing.  HENT:     Head: Normocephalic and atraumatic.     Right Ear: Ear canal normal. A  middle ear effusion is present. Tympanic membrane is erythematous.     Left Ear: Tympanic membrane and ear canal normal.     Nose: Congestion present.     Mouth/Throat:     Mouth: Mucous membranes are moist.     Pharynx: Oropharynx is clear. Uvula midline. No oropharyngeal exudate or posterior oropharyngeal erythema.     Tonsils: No tonsillar exudate or  tonsillar abscesses.  Eyes:     Conjunctiva/sclera: Conjunctivae normal.     Pupils: Pupils are equal, round, and reactive to light.  Cardiovascular:     Rate and Rhythm: Normal rate and regular rhythm.     Heart sounds: Normal heart sounds.  Pulmonary:     Effort: Pulmonary effort is normal.     Breath sounds: Normal breath sounds. No wheezing, rhonchi or rales.  Musculoskeletal:     Cervical back: Normal range of motion and neck supple.  Lymphadenopathy:     Cervical: No cervical adenopathy.  Skin:    General: Skin is warm and dry.  Neurological:     General: No focal deficit present.     Mental Status: She is alert and oriented to person, place, and time.  Psychiatric:        Mood and Affect: Mood normal.        Behavior: Behavior normal.      UC Treatments / Results  Labs (all labs ordered are listed, but only abnormal results are displayed) Labs Reviewed - No data to display  Comprehensive metabolic panel Order: [id number]  Status: Final result     Next appt: 08/21/2024 at 01:20 PM in Family Medicine Clarisa Crooked, MD)     Dx: Type 2 diabetes mellitus with obesity...   Test Result Released: No (inaccessible in MyChart)   2 Result Notes     1 HM Topic          Component Ref Range & Units (hover) 6 mo ago (10/10/23) 1 yr ago (06/01/22) 5 yr ago (07/03/18) 6 yr ago (02/27/18) 10 yr ago (07/27/13) 10 yr ago (07/19/13) 11 yr ago (02/06/13)  Sodium 139 138 137 140 141 138 137  Potassium 3.6 3.7 3.9 4.3 3.8 R 4.0 R 4.5 R  Chloride 105 105 103 103 104 102 103  CO2 26 26 29 30 26 28 27   Glucose, Bld 102 High  79 99 96 118 High  106 High  127 High   BUN 10 6 7 8 7 13 10   Creatinine, Ser 0.56 0.56 0.74 0.59 0.61 R 0.63 R 0.58 R  Total Bilirubin 0.5 0.4 0.5 0.4 0.4 R 0.4 R 0.4 R  Alkaline Phosphatase 154 High  88 97 180 High  119 High  148 High  158 High   AST 35 21 17 26 20 28 26   ALT 55 High  17 15 53 High  26 46 High  40 High   Total Protein 7.1 7.0 7.4 7.0 7.1 7.5  7.4  Albumin 4.2 4.1 4.3 4.0 4.1 4.3 4.5  GFR 97.23 98.16 CM 103.67 134.80     Comment: Calculated using the CKD-EPI Creatinine Equation (2021)  Calcium  9.2 9.3 9.9 9.5 9.3 9.7 9.6  Resulting Agency [place] [facility] [place] [facility] [place] [facility] [place] [facility]        Specimen Collected: 10/10/23 09:49 Last Resulted: 10/10/23 14:59    EKG   Radiology DG Chest 2 View Result Date: 04/08/2024 CLINICAL DATA:  cough x 1 week EXAM: CHEST - 2 VIEW COMPARISON:  February 01, 2013. FINDINGS: The cardiomediastinal silhouette is unchanged in contour. No pleural effusion. No pneumothorax. LEFT basilar linear opacity. Visualized abdomen is unremarkable. Multilevel degenerative changes of the thoracic spine. IMPRESSION: LEFT basilar linear opacity, favored to reflect atelectasis. Electronically Signed   By: Clancy Crimes M.D.   On: 04/08/2024 14:58    Procedures Procedures (including critical care time)  Medications Ordered in UC Medications - No data to display  Initial Impression / Assessment and Plan / UC Course  I have reviewed the triage vital signs and the nursing notes.  Pertinent labs & imaging results that were available during my care of the patient were reviewed by me and considered in my medical decision making (see chart for details).     Reviewed exam and symptoms with patient.  No red flags.  Will start Augmentin twice daily for 10 days for right OM/bronchitis/sinusitis.  Chest x-ray shows atelectasis without obvious consolidation.  Promethazine DM as needed for cough, side effect profile reviewed.  Advise rest fluids and PCP follow-up 2 to 3 days for recheck.  ER precautions reviewed and patient verbalized understanding. Final Clinical Impressions(s) / UC Diagnoses   Final diagnoses:  Acute cough  Sinobronchitis  Acute suppurative otitis media of right ear without spontaneous rupture of tympanic membrane, recurrence not specified     Discharge  Instructions      Start Augmentin twice daily for 10 days.  You may take Promethazine DM as needed for your cough.  Please this medication will make you drowsy.  Do not drink alcohol or drive on this medication.  Lots of rest and fluids.  Please follow-up with your PCP if your symptoms do not improve.  Please go to the ER if you develop any worsening symptoms.  Hope you feel better soon!   ED Prescriptions     Medication Sig Dispense Auth. Provider   amoxicillin-clavulanate (AUGMENTIN) 875-125 MG tablet Take 1 tablet by mouth every 12 (twelve) hours for 10 days. 20 tablet Ashiah Karpowicz, Jodi R, NP   promethazine-dextromethorphan (PROMETHAZINE-DM) 6.25-15 MG/5ML syrup Take 5 mLs by mouth 3 (three) times daily as needed for cough. 118 mL Phinley Schall, Jodi R, NP      PDMP not reviewed this encounter.   Alleen Arbour, NP 04/08/24 [phone]

## 2024-04-08 NOTE — ED Triage Notes (Signed)
 Pt states she had a fever of 101, laryngitisx3d, loss of smell, sore throat, right ear pain, blood mixed in nasal mucous when using nasal spray, head pressure, chills. Pt states this has all been going on for a week. Pt states she used OTC meds, but none of it has helped. Pt states the fever and chills has resolved.

## 2024-04-30 ENCOUNTER — Other Ambulatory Visit: Payer: Self-pay | Admitting: Sports Medicine

## 2024-08-21 ENCOUNTER — Ambulatory Visit: Payer: Self-pay | Admitting: Family Medicine

## 2024-08-21 ENCOUNTER — Encounter: Payer: Self-pay | Admitting: Family Medicine

## 2024-08-21 ENCOUNTER — Ambulatory Visit: Admitting: Family Medicine

## 2024-08-21 VITALS — BP 136/86 | HR 86 | Temp 97.7°F | Ht 63.0 in | Wt 191.2 lb

## 2024-08-21 DIAGNOSIS — E119 Type 2 diabetes mellitus without complications: Secondary | ICD-10-CM

## 2024-08-21 DIAGNOSIS — E785 Hyperlipidemia, unspecified: Secondary | ICD-10-CM

## 2024-08-21 DIAGNOSIS — Z90710 Acquired absence of both cervix and uterus: Secondary | ICD-10-CM

## 2024-08-21 DIAGNOSIS — Z0000 Encounter for general adult medical examination without abnormal findings: Secondary | ICD-10-CM

## 2024-08-21 DIAGNOSIS — I10 Essential (primary) hypertension: Secondary | ICD-10-CM | POA: Diagnosis not present

## 2024-08-21 DIAGNOSIS — E1169 Type 2 diabetes mellitus with other specified complication: Secondary | ICD-10-CM

## 2024-08-21 DIAGNOSIS — Z7985 Long-term (current) use of injectable non-insulin antidiabetic drugs: Secondary | ICD-10-CM | POA: Diagnosis not present

## 2024-08-21 DIAGNOSIS — Z23 Encounter for immunization: Secondary | ICD-10-CM | POA: Diagnosis not present

## 2024-08-21 LAB — URINALYSIS, ROUTINE W REFLEX MICROSCOPIC
Bilirubin Urine: NEGATIVE
Hgb urine dipstick: NEGATIVE
Ketones, ur: NEGATIVE
Leukocytes,Ua: NEGATIVE
Nitrite: NEGATIVE
RBC / HPF: NONE SEEN (ref 0–?)
Specific Gravity, Urine: 1.02 (ref 1.000–1.030)
Total Protein, Urine: NEGATIVE
Urine Glucose: NEGATIVE
Urobilinogen, UA: 0.2 (ref 0.0–1.0)
pH: 7 (ref 5.0–8.0)

## 2024-08-21 LAB — LIPID PANEL
Cholesterol: 233 mg/dL — ABNORMAL HIGH (ref 0–200)
HDL: 47.3 mg/dL
LDL Cholesterol: 141 mg/dL — ABNORMAL HIGH (ref 0–99)
NonHDL: 185.94
Total CHOL/HDL Ratio: 5
Triglycerides: 224 mg/dL — ABNORMAL HIGH (ref 0.0–149.0)
VLDL: 44.8 mg/dL — ABNORMAL HIGH (ref 0.0–40.0)

## 2024-08-21 LAB — CBC WITH DIFFERENTIAL/PLATELET
Basophils Absolute: 0 K/uL (ref 0.0–0.1)
Basophils Relative: 0.7 % (ref 0.0–3.0)
Eosinophils Absolute: 0.3 K/uL (ref 0.0–0.7)
Eosinophils Relative: 4.6 % (ref 0.0–5.0)
HCT: 41.6 % (ref 36.0–46.0)
Hemoglobin: 13.3 g/dL (ref 12.0–15.0)
Lymphocytes Relative: 31.9 % (ref 12.0–46.0)
Lymphs Abs: 2.2 K/uL (ref 0.7–4.0)
MCHC: 31.9 g/dL (ref 30.0–36.0)
MCV: 86.1 fl (ref 78.0–100.0)
Monocytes Absolute: 0.6 K/uL (ref 0.1–1.0)
Monocytes Relative: 7.9 % (ref 3.0–12.0)
Neutro Abs: 3.9 K/uL (ref 1.4–7.7)
Neutrophils Relative %: 54.9 % (ref 43.0–77.0)
Platelets: 331 K/uL (ref 150.0–400.0)
RBC: 4.83 Mil/uL (ref 3.87–5.11)
RDW: 15.1 % (ref 11.5–15.5)
WBC: 7 K/uL (ref 4.0–10.5)

## 2024-08-21 LAB — COMPREHENSIVE METABOLIC PANEL WITH GFR
ALT: 25 U/L (ref 0–35)
AST: 28 U/L (ref 0–37)
Albumin: 4.4 g/dL (ref 3.5–5.2)
Alkaline Phosphatase: 118 U/L — ABNORMAL HIGH (ref 39–117)
BUN: 9 mg/dL (ref 6–23)
CO2: 31 meq/L (ref 19–32)
Calcium: 10.3 mg/dL (ref 8.4–10.5)
Chloride: 103 meq/L (ref 96–112)
Creatinine, Ser: 0.6 mg/dL (ref 0.40–1.20)
GFR: 95.05 mL/min (ref >60.00)
Glucose, Bld: 94 mg/dL (ref 70–99)
Potassium: 4.5 meq/L (ref 3.5–5.1)
Sodium: 139 meq/L (ref 135–145)
Total Bilirubin: 0.3 mg/dL (ref 0.2–1.2)
Total Protein: 7.9 g/dL (ref 6.0–8.3)

## 2024-08-21 LAB — MICROALBUMIN / CREATININE URINE RATIO
Creatinine,U: 97 mg/dL
Microalb Creat Ratio: 8 mg/g (ref 0.0–30.0)
Microalb, Ur: 0.8 mg/dL (ref 0.0–1.9)

## 2024-08-21 LAB — HEMOGLOBIN A1C: Hgb A1c MFr Bld: 6.4 % (ref 4.6–6.5)

## 2024-08-21 NOTE — Addendum Note (Signed)
 Addended by: KATRINKA GARNETTE KIDD on: 08/21/2024 02:44 PM   Modules accepted: Level of Service

## 2024-08-21 NOTE — Patient Instructions (Addendum)
 Lipoma- on right hip- seems to be getting bigger- she is not ready to pursue surgery but can reach out if she changes her mind or if becomes painful  Schedule your next mammogram- at minimum every 2 years- sorry they misdirected you on the 5 year recommendation  Blood pressure slightly high but acceptable- lets work on healthy eating and regular exercise to bring this down. Excited about your walking peripheral arterial disease!   Please stop by lab before you go If you have mychart- we will send your results within 3 business days of us  receiving them.  If you do not have mychart- we will call you about results within 5 business days of us  receiving them.  *please also note that you will see labs on mychart as soon as they post. I will later go in and write notes on them- will say notes from Dr. Katrinka   No changes today unless labs lead us  to make changes  Recommended follow up: Return in about 6 months (around 02/18/2025) for followup or sooner if needed.Schedule b4 you leave.

## 2024-08-21 NOTE — Progress Notes (Signed)
 Phone [phone]   Subjective:  Patient presents today for their annual physical. Chief complaint-noted.   See problem oriented charting- ROS- full  review of systems was completed and negative except for topics noted under acute/chronic concerns   The following were reviewed and entered/updated in epic: Past Medical History:  Diagnosis Date   Allergic rhinitis    pollen- neti pots   Chicken pox    Hiatal hernia    no issues per patient   Hyperlipidemia 02/20/2018   Obesity    Patient Active Problem List   Diagnosis Date Noted   Type 2 diabetes mellitus with obesity 02/20/2018    Priority: High   Essential hypertension 10/10/2023    Priority: Medium    Hyperlipidemia 02/20/2018    Priority: Medium    History of adenomatous polyp of colon 08/29/2018    Priority: Low   Former smoker 02/20/2018    Priority: Low   Lipoma 02/20/2018    Priority: Low   Allergic rhinitis     Priority: Low   History of hiatal hernia     Priority: Low   History of total hysterectomy 08/21/2024   Past Surgical History:  Procedure Laterality Date   ABDOMINAL HYSTERECTOMY  01/2010   TAH,RSO. large fibroids   DENTAL SURGERY     63 yo- wisdom teeth   EXPLORATORY LAPAROTOMY  07/22/2011   WITH EXT ADHESIOLYSIS, RESECTION OF LARGE PELVIC CYST, LSO AND PROCTOSCOPUY AT [facility]   HYSTEROSCOPY  2007   HYSTEROSCOPIC MYOMECTOMY. states 2 removals then later hystrectomy   MENISCUS REPAIR     R in [place] . later left in [place]   MYOMECTOMY     OOPHORECTOMY     one side. RSO W/TAH 01/2010--LSO W/ EXP LAP 07/22/11 AT [facility]   TONSILLECTOMY AND ADENOIDECTOMY      Family History  Problem Relation Age of Onset   Hypertension Mother    Hyperlipidemia Mother    Heart disease Mother        Stent age 50. defibrillator.    Heart failure Mother    Diabetes Mother    Valvular heart disease Mother        valve replacement   Prostate cancer Father    Ovarian cancer Sister        Half sister   Liver cancer  Brother    Heart disease Brother        mid 73s- first stent   Diabetes Brother    Hypertension Brother    Hyperlipidemia Brother    Heart attack Maternal Grandmother        23   Stroke Maternal Grandfather    Breast cancer Neg Hx     Medications- reviewed and updated Current Outpatient Medications  Medication Sig Dispense Refill   lisinopril  (ZESTRIL ) 10 MG tablet Take 1 tablet (10 mg total) by mouth daily. 90 tablet 3   meloxicam  (MOBIC ) 15 MG tablet Take 1 tablet (15 mg total) by mouth daily. 30 tablet 0   rosuvastatin  (CRESTOR ) 20 MG tablet Take 1 tablet (20 mg total) by mouth once a week. 13 tablet 3   Semaglutide , 1 MG/DOSE, (OZEMPIC , 1 MG/DOSE,) 4 MG/3ML SOPN INJECT 1MG  SUBCUTANEOUSLY ONE TIME PER WEEK ON THE SAME DAY EACH WEEK 3 mL 5   No current facility-administered medications for this visit.    Allergies-reviewed and updated No Known Allergies  Social History   Social History Narrative   Divorced. No children. [place] metro terrier.    Mom lives with her.  Work in [place]  - [employer] at [place]. 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    Loves her job   Dispensing optician- history/english.       Hobbies: TV nut- enjoys old movies   Objective  Objective:  BP 136/86   Pulse 86   Temp 97.7 F (36.5 C)   Ht 5' 3 (1.6 m)   Wt 191 lb 3.2 oz (86.7 kg)   SpO2 96%   BMI 33.87 kg/m  Gen: NAD, resting comfortably HEENT: Mucous membranes are moist. Oropharynx normal Neck: no thyromegaly CV: RRR no murmurs rubs or gallops Lungs: CTAB no crackles, wheeze, rhonchi Abdomen: soft/nontender/nondistended/normal bowel sounds. No rebound or guarding.  Ext: no edema Skin: warm, dry Neuro: grossly normal, moves all extremities, PERRLA   Assessment and Plan   63 y.o. female presenting for annual physical.  Health Maintenance counseling: 1. Anticipatory guidance: Patient counseled regarding regular dental exams -q6  months, eye exams - yearly for diabetes ,  avoiding smoking and second hand smoke , limiting alcohol to 1 beverage per day- none at all , no illicit drugs .   2. Risk factor reduction:  Advised patient of need for regular exercise and diet rich and fruits and vegetables to reduce risk of heart attack and stroke.  Exercise- ordered flat treadmill that she plans to use at home- great idea.  Diet/weight management-prior substantial weight loss down from 270 with keto diet along with Ozempic - she's somewhat discouraged as got a slow as 153.  Has fallen off her keto which had helped her previously- may restart- also discussed possible Ozempic  increase vs Mounjaro- will watch a1c and weight trend  Wt Readings from Last 3 Encounters:  08/21/24 191 lb 3.2 oz (86.7 kg)  10/31/23 185 lb (83.9 kg)  10/10/23 180 lb 9.6 oz (81.9 kg)  3. Immunizations/screenings/ancillary studies- dclines shingrix, COVID. Flu shot today Immunization History  Administered Date(s) Administered   Influenza, Seasonal, Injecte, Preservative Fre 08/21/2024   Influenza,inj,Quad PF,6+ Mos 08/07/2023   Moderna Covid-19 Fall Seasonal Vaccine 67yrs & older 09/06/2023   PNEUMOCOCCAL CONJUGATE-20 10/10/2023   Pfizer(Comirnaty)Fall Seasonal Vaccine 12 years and older 08/23/2022   Respiratory Syncytial Virus Vaccine,Recomb Aduvanted(Arexvy) 11/19/2022   Tdap 02/20/2018   Zoster Recombinant(Shingrix) 08/29/2022, 02/28/2023   4. Cervical cancer screening- with total hysterectomy- has seen GYN and released from further pap smears- does not have cervix 5. Breast cancer screening-  breast exam with GYN [facility] and mammogram - 2023 and plans to schedule follow up  6. Colon cancer screening - tubular adenoma history but declines colonoscopy despite risks of cancer-unfortunately the test itself is apparently $600 per her report.  She plans to recheck when goes on medicare as currently cost prohibitive 7. Skin cancer screening- lower risk due  to melanin content. advised regular sunscreen use. Denies worrisome, changing, or new skin lesions.  8. Birth control/STD check- postmenopausal /not dating 76. Osteoporosis screening at 80- has had with gynecology- consider repeat at 64 10. Smoking associated screening - former smoker- quit 2006 but under 2 pack years so no regular screening  Status of chronic or acute concerns   #social update- lost mom/best friend in last year- really misses her  # Diabetes- new diagnosis in early 2022 with [facility] S: Medication:ozempic  1 mg  -previously prescribed metformin  (she denies every taking) Lab Results  Component Value Date   HGBA1C 5.6 10/10/2023   HGBA1C 6.0 06/01/2022   HGBA1C 6.4 07/03/2018  A/P: hopefully stable- update  a1c today. Continue current meds for now  -if #s up shed like to try 2 mg   #hypertension S: medication: lisinopril  10 mg.  BP Readings from Last 3 Encounters:  08/21/24 136/86  04/08/24 (!) 155/91  10/31/23 138/88  A/P: high acceptable but improved  #hyperlipidemia S: Medication:Rosuvastatin  20 mg weekly Lab Results  Component Value Date   CHOL 237 (H) 10/10/2023   HDL 49.20 10/10/2023   LDLCALC 156 (H) 10/10/2023   LDLDIRECT 166.0 07/03/2018   TRIG 160.0 (H) 10/10/2023   CHOLHDL 5 10/10/2023   A/P: #s high but she has wanted to avoid increasing dose and work on diet  #Lipoma- on right hip- seems to be getting bigger- she is not ready to pursue surgery but can reach out if she changes her mind or if becomes painful  Recommended follow up: Return in about 6 months (around 02/18/2025) for followup or sooner if needed.Schedule b4 you leave.  Lab/Order associations: fasting   ICD-10-CM   1. Preventative health care  Z00.00     2. Need for influenza vaccination  Z23 Flu vaccine trivalent PF, 6mos and older(Flulaval,Afluria,Fluarix,Fluzone)    3. Type 2 diabetes mellitus with obesity  E11.69 Comprehensive metabolic panel with GFR   E66.9 CBC with  Differential/Platelet    Lipid panel    Hemoglobin A1c    Microalbumin / creatinine urine ratio    Urinalysis, Routine w reflex microscopic    4. Hyperlipidemia, unspecified hyperlipidemia type  E78.5     5. Essential hypertension  I10 Urinalysis, Routine w reflex microscopic    6. History of total hysterectomy  Z90.710      No orders of the defined types were placed in this encounter.  Return precautions advised.  Garnette Lukes, MD

## 2024-10-10 ENCOUNTER — Other Ambulatory Visit (HOSPITAL_COMMUNITY): Payer: Self-pay

## 2024-11-01 ENCOUNTER — Other Ambulatory Visit: Payer: Self-pay | Admitting: Family Medicine

## 2024-11-21 ENCOUNTER — Other Ambulatory Visit (HOSPITAL_COMMUNITY): Payer: Self-pay

## 2024-11-21 ENCOUNTER — Telehealth: Payer: Self-pay

## 2024-11-21 NOTE — Telephone Encounter (Signed)
 Copied from CRM #[id number]. Topic: Clinical - Medication Question >> Nov 20, 2024  2:33 PM Patty Perkins wrote: Reason for CRM: Pt was advised by PCP in her last visit that there would be medication changes to Semaglutide , 1 MG/DOSE, (OZEMPIC , 1 MG/DOSE,) 4 MG/3ML SOPN. Pt has been having trouble obtaining this med through pharm as it is always back ordered. Please advise pt on #[id number]

## 2024-11-21 NOTE — Telephone Encounter (Signed)
 Pharmacy Patient Advocate Encounter   Received notification from Onbase that prior authorization for Ozempic  (1 MG/DOSE) 4MG /3ML pen-injectors is required/requested.   Insurance verification completed.   The patient is insured through [facility]   Per test claim: PA required; PA submitted to above mentioned insurance via Latent Key/confirmation #/EOC [id number] Status is pending

## 2024-11-23 ENCOUNTER — Other Ambulatory Visit (HOSPITAL_BASED_OUTPATIENT_CLINIC_OR_DEPARTMENT_OTHER): Payer: Self-pay

## 2024-11-23 ENCOUNTER — Other Ambulatory Visit (HOSPITAL_COMMUNITY): Payer: Self-pay

## 2024-11-23 ENCOUNTER — Other Ambulatory Visit: Payer: Self-pay | Admitting: Family Medicine

## 2024-11-23 MED ORDER — OZEMPIC (1 MG/DOSE) 4 MG/3ML ~~LOC~~ SOPN
1.0000 mg | PEN_INJECTOR | SUBCUTANEOUS | 5 refills | Status: AC
  Filled 2024-11-23: qty 3, 28d supply, fill #0

## 2024-11-23 NOTE — Telephone Encounter (Signed)
 Pharmacy Patient Advocate Encounter  Received notification from [facility] that Prior Authorization for Ozempic  (1 MG/DOSE) 4MG /3ML pen-injectors  has been APPROVED from 10/22/24 to 11/21/25. Unable to obtain price due to refill too soon rejection, last fill date 11/21/24 next available fill date 11/21/25   PA #/Case ID/Reference #: [id number] .

## 2024-11-23 NOTE — Telephone Encounter (Signed)
Will alert patient

## 2024-11-23 NOTE — Progress Notes (Signed)
 Patient has had issues getting her Ozempic  and out for 3 weeks. Appears to have been caught in prior authorization. There was active prescription at pharmacy but patient was unable to get there or another pharmacy. She's looking for an alternative medication(s) that I'm not sure what she's particularly referencing- we opted to try this medicine at [facility].

## 2024-11-29 ENCOUNTER — Other Ambulatory Visit: Payer: Self-pay | Admitting: Family Medicine

## 2024-12-03 ENCOUNTER — Telehealth: Payer: Self-pay | Admitting: Family Medicine

## 2024-12-03 NOTE — Telephone Encounter (Signed)
 LVM to r/s 02/18/25 appt

## 2025-02-18 ENCOUNTER — Ambulatory Visit: Admitting: Family Medicine
